# Patient Record
Sex: Male | Born: 1945 | Race: White | Hispanic: No | Smoking: Former smoker
Health system: Southern US, Community
[De-identification: ages and names within clinical notes are randomized; demographics above are authoritative.]

## PROBLEM LIST (undated history)

## (undated) DIAGNOSIS — E785 Hyperlipidemia, unspecified: Secondary | ICD-10-CM

## (undated) DIAGNOSIS — I4891 Unspecified atrial fibrillation: Secondary | ICD-10-CM

## (undated) DIAGNOSIS — I5022 Chronic systolic (congestive) heart failure: Secondary | ICD-10-CM

## (undated) DIAGNOSIS — J449 Chronic obstructive pulmonary disease, unspecified: Secondary | ICD-10-CM

## (undated) DIAGNOSIS — I10 Essential (primary) hypertension: Secondary | ICD-10-CM

## (undated) DIAGNOSIS — E119 Type 2 diabetes mellitus without complications: Secondary | ICD-10-CM

## (undated) HISTORY — PX: SKIN CANCER EXCISION: SHX779

## (undated) HISTORY — PX: TONSILLECTOMY: SUR1361

## (undated) HISTORY — PX: NOSE SURGERY: SHX723

---

## 2008-12-18 ENCOUNTER — Inpatient Hospital Stay: Payer: Self-pay | Admitting: Specialist

## 2014-04-05 ENCOUNTER — Emergency Department: Payer: Self-pay

## 2014-11-07 ENCOUNTER — Observation Stay: Payer: Self-pay | Admitting: Internal Medicine

## 2015-01-06 NOTE — Consult Note (Signed)
PATIENT NAME:  Victor [name], Victor [name] MR#:  [id number] DATE OF BIRTH:  1946-01-11  DATE OF CONSULTATION:  11/07/2014  REFERRING PHYSICIAN:  Enid Baas, MD CONSULTING PHYSICIAN:  Dwayne D. Callwood, MD  PRIMARY CARE PHYSICIAN:  [facility] [place]   INDICATION: Palpitations and chest pain, what appears to be atrial fibrillation.    HISTORY OF PRESENT ILLNESS: The patient is a 69 year old white male with past history of hypertension, diabetes, hyperlipidemia, congestive heart failure came in with palpitations recurrent and vague chest tightness.  We found that his heart rate was up to 140.  Denies any previous diagnosis of atrial fibrillation or arrhythmia. He has had skipping beats in the past. The patient had a cardiac catheterization a few years ago for heart failure and appeared to not have any significant coronary artery disease at the time.  He was treated with Lasix which he uses p.r.n.  Denies any significant angina. No blackout spells or syncope.  He is being admitted for evaluation of atrial fibrillation.   PAST MEDICAL HISTORY: Hypertension, hyperlipidemia, congestive heart failure, diabetes,   PAST SURGICAL HISTORY: Nose surgery.   ALLERGIES: None.   MEDICATIONS: Allopurinol 3 tablets daily, Norvasc 10 mg a day,  12% topical cream to affected area twice a day, aspirin 81 mg a day, bupropion 150 mg every 12 hours, Coreg 25 twice a day, diclofenac 75 mg twice a day, glipizide 10 mg 2 tablets twice a day, potassium chloride 20 mEq a day, simvastatin 20 a bedtime.   SOCIAL HISTORY: Lives at home by himself; his wife in [place]. No alcohol abuse.   FAMILY HISTORY: Cardiac issues, kidney disease, congestive heart failure.   REVIEW OF SYSTEMS:  No blackout spells or syncope. No nausea, vomiting. Denies fever, chills, sweats. Denies weight loss, weight gain. No hemoptysis or hematemesis. No bright red blood per rectum. He has had palpitations, tachycardia, and irregular heartbeat.   PHYSICAL  EXAMINATION:  VITAL SIGNS: Blood pressure 130/90, pulse 90, respiratory rate 16, afebrile.  HEENT: Normocephalic, atraumatic. Pupils equal and reactive to light.  NECK: Supple. No significant JVD, bruits or adenopathy.  LUNGS: Clear to auscultation and percussion. No significant wheezing, rhonchi, or rale. HEART:  At this point is regular. Systolic ejection murmur left sternal border. Positive S4. No significant S3.  ABDOMEN: Benign.  EXTREMITIES: Within normal limits.  NEUROLOGIC: Intact.  SKIN: Normal.   LABORATORY DATA: Initial EKG: Rapid atrial fibrillation, rate of 130, nonspecific findings. Sodium of 140, potassium 3.0, chloride 103, bicarbonate of 25, BUN of 22, creatinine 1.2, glucose of 228. LFTs negative, CK 57.  MB 1.1. Troponin 0.02. INR 0.9. White count 54, hemoglobin 12, hematocrit 36, platelet count 173,000.  BNP was elevated. TSH 2.37. Chest x-ray showed cardiomegaly. No significant heart failure.   ASSESSMENT:  1.  New-onset atrial fibrillation. 2.  Diabetes. 3.  Hypertension. 4.  Hypokalemia. 5.  History of congestive heart failure. 6.  Mild obesity. 7.  Gout.    PLAN:  1.  Agree with admit. Treat atrial fibrillation and rate control. Consider rhythm control as well as anticoagulation based on his significant risk for cerebrovascular accident by CHADS2 score so low long-term anticoagulation should be helpful.  2.  Would probably recommend evaluation for coronary artery disease at some point possibly a functional study, but that can be done as an outpatient.  3.  Diabetes, continue current therapy with medication for strict control including weight loss.  4.  Hypertension continue Coreg therapy as necessary.  5.  Continue  gout therapy with allopurinol.  6.  Recommend physical therapy for activity.  7.  Consider a sleep study for possible obstructive sleep apnea.  8.  Hyperlipidemia, continue simvastatin. Lipid and liver studies should be followed by his primary  physician.  9.  Continue low dose aspirin for now at 81 mg, including a long-term anticoagulation.  10. Hypertension,  with Norvasc as well as Coreg.   Have the patient follow up with cardiology either here or [facility].  Would recommend a conservative cardiology input except for surveillance for possible coronary artery disease.      ____________________________ Bobbie Stackwayne D. Juliann Paresallwood, MD ddc:DT D: 11/08/2014 05:59:00 ET T: 11/08/2014 11:17:59 ET JOB#: [id number]  cc: Dwayne D. Juliann Paresallwood, MD, <Dictator> Alwyn PeaWAYNE D CALLWOOD MD ELECTRONICALLY SIGNED 11/25/2014 17:41

## 2015-01-06 NOTE — H&P (Signed)
PATIENT NAME:  Victor [name], Victor [name] MR#:  [id number] DATE OF BIRTH:  1945/11/19  DATE OF ADMISSION:  11/07/2014  ADMITTING PHYSICIAN: Enid Baas, MD   PRIMARY CARE PHYSICIAN: [facility]  CHIEF COMPLAINT: Palpitations.   HISTORY OF PRESENT ILLNESS: Mr. Coryell is a 69 year old Caucasian male with past medical history significant for hypertension, non-insulin-dependent diabetes mellitus, hyperlipidemia, possible congestive heart failure, who presents to the hospital secondary to worsening palpitations since last night. The patient is noted to be in atrial fibrillation with heart rate ranging from 100 to 140. The patient denies any previous diagnosis of atrial fibrillation or any other arrhythmias. He said he had trouble with skipping beats about 6 years ago, at which time, at the [place], he had a cardiac catheterization and it seemed, according to his report, everything was normal at the time. He was treated for possible congestive heart failure with Lasix and does not know his ejection fraction, but does use Lasix p.r.n. at home. Denies any chest pain, nausea, vomiting, fevers, dyspnea at this time. He is being admitted under observation for his atrial fibrillation.   PAST MEDICAL HISTORY:  1. Hypertension.  2. Hyperlipidemia.  3. Congestive heart failure.   PAST SURGICAL HISTORY: Nose cartilage surgery.   ALLERGIES TO MEDICATIONS: NO KNOWN DRUG ALLERGIES.   CURRENT HOME MEDICATIONS:  1. Allopurinol 100 mg 2 tablets p.o. daily.  2. Norvasc 10 mg p.o. daily.  3. Ammonium lactate 12% topical cream to affected area twice a day.  4. Aspirin 81 mg p.o. daily.  5. Bupropion 150 mg p.o. 12 hours.  6. Coreg 25 mg p.o. b.i.d.  7. Diclofenac sodium 75 mg p.o. twice a day.  8. Glipizide 10 mg 2 tablets p.o. b.i.d.  9. Potassium chloride 20 mEq p.o. daily on days with Lasix.  10. Simvastatin 20 mg p.o. daily at bedtime.   SOCIAL HISTORY: Lives at home by himself. Does not use any assisting devices for  ambulation. Wife has cortico-basilar deterioration dementia and is a resident of [facility] now. The patient denies any smoking or alcohol use.   FAMILY HISTORY: Mom passed away at the age of 12. She did have a cardiac issues and kidney issues. Dad died in his 37s and had congestive heart failure.   REVIEW OF SYSTEMS:  CONSTITUTIONAL: No fatigue, fever, or weakness.  EYES: No blurred vision, double vision, inflammation or glaucoma. Uses glasses.  ENT: No tinnitus, ear pain, hearing loss, epistaxis or discharge.  RESPIRATORY: No cough, wheezing, hemoptysis, or chronic obstructive pulmonary disease.  CARDIOVASCULAR: No chest pain, orthopnea, edema. Positive for arrhythmia and palpitations. No syncope.  GASTROINTESTINAL: No nausea, vomiting, diarrhea, abdominal pain, hematemesis, or melena.  GENITOURINARY: No dysuria, hematuria, renal calculus, frequency, or incontinence.  ENDOCRINE: No polyuria, nocturia, thyroid problems, heat or cold intolerance.  HEMATOLOGY: No anemia, easy bruising or bleeding.  SKIN: No acne, rash or lesions.  MUSCULOSKELETAL: No neck  or back pain, arthritis or gout.  NEUROLOGIC: No numbness, weakness, CVA, transient ischemic attack, or seizures.  PSYCHOLOGICAL: No anxiety or insomnia. Positive for depression.   PHYSICAL EXAMINATION:  VITAL SIGNS: Temperature 97.1 degrees Fahrenheit, pulse 89, respirations 18, blood pressure 124/91, pulse oximetry 95% on room air.  GENERAL: Well-built, well-nourished male, lying in bed, not in any acute distress.  HEENT: Normocephalic, atraumatic. Pupils equal, round, reacting to light. Anicteric sclerae. Extraocular movements intact.  OROPHARYNX: Clear without erythema, mass, or exudates.  NECK: Supple. No thyromegaly, JVD or carotid bruits. No lymphadenopathy.  LUNGS: Moving air  bilaterally. No wheeze or crackles. No use of accessory muscles for breathing.  CARDIOVASCULAR: S1, S2, irregular rhythm and normal rate. No murmurs,  rubs, or gallops.  ABDOMEN: Soft, nontender, nondistended. No hepatosplenomegaly. Normal bowel sounds.  EXTREMITIES: No pedal edema. No clubbing or cyanosis, 2+ dorsalis pedis pulses palpable bilaterally.  SKIN: No acne, rash or lesions.  LYMPHATICS: No cervical or inguinal lymphadenopathy.  NEUROLOGIC: Cranial nerves intact. No focal motor or sensory deficits. PSYCHOLOGIC: The patient is awake, alert, oriented x3.   LABORATORY DATA: Sodium 140, potassium 3.0, chloride 103, bicarbonate 25, BUN 22, creatinine 1.21, glucose 228, and calcium of 8.4.   ALT 23, AST 27, alkaline phosphatase 132, total bilirubin 0.4 and albumin of 3.0.   CK is 57, CK-MB is 1.1 and a troponin 0.03. INR 0.9.   WBC 5.4, hemoglobin 12.3, hematocrit 36.5, platelet count 173. TSH within normal limits at 2.37. BNP is slightly elevated at 519,000 and serum magnesium is 2.4. Chest x-ray showing mild cardiomegaly without congestive heart failure and EKG showing atrial fibrillation with rapid ventricular response. Heart rate of 112.   ASSESSMENT AND PLAN: This is a 69 year old male with history of hypertension, diabetes, hyperlipidemia, congestive heart failure, admitted for new onset atrial fibrillation.   1. New onset atrial fibrillation. Intravenous Cardizem pushes p.r.n., started on p.o. metoprolol. No need for any drip now. CHA2DS2-VAS score is about 4. We will start anticoagulation with Eliquis. Cardiology has been consulted. Echocardiogram has been ordered. Replace potassium.  2. Hypertension. Need to verify home medications: Will restart on his home medications now.  3. Diabetes mellitus on glipizide and sliding scale insulin.  4. Hypokalemia- being replaced.   5. Congestive heart failure, unknown ejection fraction. Followup echocardiogram. His last cardiac catheterization is normal from 2010. Chest x-ray showing no congestion. Well compensated, so hold off Lasix at this time.  6. Deep vein thrombosis prophylaxis.    CODE STATUS: Full code.   TIME SPENT ON ADMISSION: 50 minutes.   ____________________________ Enid Baasadhika Caedan Sumler, MD rk:ap D: 11/07/2014 16:28:20 ET T: 11/07/2014 17:08:00 ET JOB#: [id number]  cc: Enid Baasadhika Eldwin Volkov, MD, <Dictator> Enid BaasADHIKA Dinari Stgermaine MD ELECTRONICALLY SIGNED 11/22/2014 18:05

## 2015-01-06 NOTE — Discharge Summary (Signed)
PATIENT NAME:  Victor [name], [name] MR#:  [id number] DATE OF BIRTH:  06-04-1946  DATE OF ADMISSION:  11/07/2014 DATE OF DISCHARGE:  11/08/2014  ADMITTING PHYSICIAN: Enid Baasadhika Hatley Henegar, MD  DISCHARGING PHYSICIAN: Enid Baasadhika Susannah Carbin, MD   [facility]  CONSULTATIONS IN THE HOSPITAL: Cardiology consultation with Dr. Juliann Paresallwood.   DISCHARGE DIAGNOSES:  1.  New onset atrial fibrillation, converted to normal sinus rhythm.  2.  Systolic congestive heart failure, well compensated, ejection fraction of 45%-50%.  3.  Hyperkalemia.  4.  Hypertension.  5.  Diabetes mellitus.  6.  Depression.  7.  Gout.   DISCHARGE HOME MEDICATIONS:  1.  Allopurinol 200 mg p.o. daily.  2.  Ammonia lactate 12% topical twice a day.  3.  Potassium chloride 20 mEq daily.  4.  Diclofenac 75 mg p.o. b.i.d.  5.  Coreg 25 mg p.o. b.i.d.  6.  Simvastatin 20 mg p.o. daily.  7.  Aspirin 81 mg p.o. daily.  8.  Glipizide 10 mg tablet 2 tablets twice a day.  9.  Norvasc 10 mg p.o. daily.  10.  Bupropion 150 mg 1 tablet p.o. b.i.d.  11.  Eliquis 5 mg p.o. b.i.d.   DISCHARGE HOME OXYGEN: None.   DISCHARGE DIET: Low sodium, ADA 1800 calorie diet.   DISCHARGE ACTIVITY: As tolerated.    FOLLOWUP INSTRUCTIONS:  1.  PCP followup in 1-2 weeks.  2.  Cardiology followup in 2-4 weeks.   LABORATORY AND IMAGING STUDIES PRIOR TO DISCHARGE:  1.  WBC 4.9, hemoglobin 12.0, hematocrit 35.9, platelet count is 156,000.  2.  Sodium 142, potassium 3.1, chloride 107, bicarbonate 26, BUN 21, creatinine 1.37, glucose 191, and calcium of 8.1. Magnesium is 2.2.  3.  Echo Doppler showing LV ejection fraction is 45%-50%, mildly increased left ventricular septal thickness.   4.  Chest x-ray showing mild cardiomegaly without any overt failure.  5.  Troponins remain negative in the hospital course. 6.  BNP slightly elevated at 519.  7.  TSH is within normal limits at 2.37.   BRIEF HOSPITAL COURSE: Victor [name] is a 69 year old  Caucasian male with past medical history significant for systolic CHF, hypertension, diabetes, hyperlipidemia, who presents to the hospital secondary to palpitations and noted to have atrial fibrillation with RVR.  1.  New onset atrial fibrillation with rapid ventricular response. CHADS-VASc score is counted to be 4 because of his other medical problems. He started on Eliquis in the hospital. He required a couple of Cardizem pushes and started on p.o. metoprolol; however, at discharge he is being changed over to his Coreg. He converted back to normal sinus rhythm has not been symptomatic. He was advised to follow with PCP as an outpatient. Seen by cardiology in the hospital who recommend to continue current management. The patient had a cardiac catheterization in the past and, according to him, he did not have any coronary artery disease.  2.  Systolic CHF, well compensated. Continue all his cardiac medications.  3.  Hypertension. Continue home medications.  4.  Depression on Wellbutrin.   His course has been otherwise uneventful in the hospital.   DISCHARGE CONDITION: Stable.   DISCHARGE DISPOSITION: Home.   TIME SPENT ON DISCHARGE: 40 minutes.    ____________________________ Enid Baasadhika Roger Fasnacht, MD rk:bm D: 11/08/2014 15:14:01 ET T: 11/08/2014 23:14:32 ET JOB#: [id number]  cc: Enid Baasadhika Britanni Yarde, MD, <Dictator> Enid BaasADHIKA Sabino Denning MD ELECTRONICALLY SIGNED 11/22/2014 18:06

## 2015-04-05 ENCOUNTER — Emergency Department: Payer: Medicare Other

## 2015-04-05 ENCOUNTER — Inpatient Hospital Stay
Admission: EM | Admit: 2015-04-05 | Discharge: 2015-04-07 | DRG: 309 | Disposition: A | Discharge status: Home / Self Care | Payer: Medicare Other | Attending: Specialist | Admitting: Specialist

## 2015-04-05 DIAGNOSIS — Z9889 Other specified postprocedural states: Secondary | ICD-10-CM | POA: Diagnosis not present

## 2015-04-05 DIAGNOSIS — N179 Acute kidney failure, unspecified: Secondary | ICD-10-CM | POA: Diagnosis present

## 2015-04-05 DIAGNOSIS — I10 Essential (primary) hypertension: Secondary | ICD-10-CM | POA: Diagnosis present

## 2015-04-05 DIAGNOSIS — E119 Type 2 diabetes mellitus without complications: Secondary | ICD-10-CM | POA: Diagnosis present

## 2015-04-05 DIAGNOSIS — I5022 Chronic systolic (congestive) heart failure: Secondary | ICD-10-CM | POA: Diagnosis present

## 2015-04-05 DIAGNOSIS — E860 Dehydration: Secondary | ICD-10-CM | POA: Diagnosis present

## 2015-04-05 DIAGNOSIS — R55 Syncope and collapse: Secondary | ICD-10-CM

## 2015-04-05 DIAGNOSIS — I482 Chronic atrial fibrillation: Secondary | ICD-10-CM | POA: Diagnosis present

## 2015-04-05 DIAGNOSIS — R001 Bradycardia, unspecified: Secondary | ICD-10-CM | POA: Diagnosis present

## 2015-04-05 DIAGNOSIS — I4891 Unspecified atrial fibrillation: Secondary | ICD-10-CM | POA: Diagnosis present

## 2015-04-05 DIAGNOSIS — E876 Hypokalemia: Secondary | ICD-10-CM | POA: Diagnosis present

## 2015-04-05 DIAGNOSIS — E785 Hyperlipidemia, unspecified: Secondary | ICD-10-CM | POA: Diagnosis present

## 2015-04-05 DIAGNOSIS — R002 Palpitations: Secondary | ICD-10-CM

## 2015-04-05 DIAGNOSIS — Z87891 Personal history of nicotine dependence: Secondary | ICD-10-CM | POA: Diagnosis not present

## 2015-04-05 DIAGNOSIS — Z794 Long term (current) use of insulin: Secondary | ICD-10-CM | POA: Diagnosis not present

## 2015-04-05 DIAGNOSIS — Z79899 Other long term (current) drug therapy: Secondary | ICD-10-CM | POA: Diagnosis not present

## 2015-04-05 HISTORY — DX: Chronic systolic (congestive) heart failure: I50.22

## 2015-04-05 HISTORY — DX: Type 2 diabetes mellitus without complications: E11.9

## 2015-04-05 HISTORY — DX: Hyperlipidemia, unspecified: E78.5

## 2015-04-05 HISTORY — DX: Essential (primary) hypertension: I10

## 2015-04-05 HISTORY — DX: Unspecified atrial fibrillation: I48.91

## 2015-04-05 LAB — CBC
HCT: 41.1 % (ref 40.0–52.0)
Hemoglobin: 13.8 g/dL (ref 13.0–18.0)
MCH: 33.2 pg (ref 26.0–34.0)
MCHC: 33.6 g/dL (ref 32.0–36.0)
MCV: 98.7 fL (ref 80.0–100.0)
Platelets: 185 10*3/uL (ref 150–440)
RBC: 4.17 MIL/uL — ABNORMAL LOW (ref 4.40–5.90)
RDW: 13.8 % (ref 11.5–14.5)
WBC: 6.5 10*3/uL (ref 3.8–10.6)

## 2015-04-05 LAB — URINALYSIS COMPLETE WITH MICROSCOPIC (ARMC ONLY)
BILIRUBIN URINE: NEGATIVE
Bacteria, UA: NONE SEEN
Glucose, UA: 50 mg/dL — AB
HGB URINE DIPSTICK: NEGATIVE
KETONES UR: NEGATIVE mg/dL
Leukocytes, UA: NEGATIVE
Nitrite: NEGATIVE
Protein, ur: 30 mg/dL — AB
Specific Gravity, Urine: 1.015 (ref 1.005–1.030)
Squamous Epithelial / LPF: NONE SEEN
pH: 6 (ref 5.0–8.0)

## 2015-04-05 LAB — APTT: APTT: 72 s — AB (ref 24–36)

## 2015-04-05 LAB — BASIC METABOLIC PANEL
Anion gap: 13 (ref 5–15)
BUN: 44 mg/dL — ABNORMAL HIGH (ref 6–20)
CALCIUM: 8.3 mg/dL — AB (ref 8.9–10.3)
CHLORIDE: 97 mmol/L — AB (ref 101–111)
CO2: 24 mmol/L (ref 22–32)
Creatinine, Ser: 2.11 mg/dL — ABNORMAL HIGH (ref 0.61–1.24)
GFR calc Af Amer: 35 mL/min — ABNORMAL LOW (ref >60)
GFR calc non Af Amer: 31 mL/min — ABNORMAL LOW (ref >60)
GLUCOSE: 290 mg/dL — AB (ref 65–99)
Potassium: 4 mmol/L (ref 3.5–5.1)
Sodium: 134 mmol/L — ABNORMAL LOW (ref 135–145)

## 2015-04-05 LAB — PROTIME-INR
INR: 2.34
Prothrombin Time: 25.8 seconds — ABNORMAL HIGH (ref 11.4–15.0)

## 2015-04-05 LAB — GLUCOSE, CAPILLARY: GLUCOSE-CAPILLARY: 207 mg/dL — AB (ref 65–99)

## 2015-04-05 LAB — TROPONIN I: Troponin I: <0.03 ng/mL (ref <0.031)

## 2015-04-05 MED ORDER — POTASSIUM CHLORIDE CRYS ER 20 MEQ PO TBCR
40.0000 meq | EXTENDED_RELEASE_TABLET | Freq: Every day | ORAL | Status: DC
  Administered 2015-04-06 – 2015-04-07 (×2): 40 meq via ORAL
  Filled 2015-04-05 (×2): qty 2

## 2015-04-05 MED ORDER — BUPROPION HCL ER (SR) 150 MG PO TB12
150.0000 mg | ORAL_TABLET | Freq: Two times a day (BID) | ORAL | Status: DC
  Administered 2015-04-05 – 2015-04-07 (×4): 150 mg via ORAL
  Filled 2015-04-05 (×6): qty 1

## 2015-04-05 MED ORDER — INSULIN ASPART 100 UNIT/ML ~~LOC~~ SOLN
0.0000 [IU] | Freq: Three times a day (TID) | SUBCUTANEOUS | Status: DC
  Filled 2015-04-05: qty 5

## 2015-04-05 MED ORDER — ACETAMINOPHEN 650 MG RE SUPP: 650.0000 mg | Freq: Four times a day (QID) | RECTAL | Status: DC | PRN

## 2015-04-05 MED ORDER — ACETAMINOPHEN 325 MG PO TABS: 650.0000 mg | ORAL_TABLET | Freq: Four times a day (QID) | ORAL | Status: DC | PRN

## 2015-04-05 MED ORDER — AMMONIUM LACTATE 12 % EX CREA: 1.0000 g | TOPICAL_CREAM | CUTANEOUS | Status: DC | PRN

## 2015-04-05 MED ORDER — DABIGATRAN ETEXILATE MESYLATE 75 MG PO CAPS
150.0000 mg | ORAL_CAPSULE | Freq: Two times a day (BID) | ORAL | Status: DC
  Administered 2015-04-05 – 2015-04-07 (×4): 150 mg via ORAL
  Filled 2015-04-05 (×4): qty 2

## 2015-04-05 MED ORDER — ALLOPURINOL 100 MG PO TABS
200.0000 mg | ORAL_TABLET | Freq: Every day | ORAL | Status: DC
  Administered 2015-04-06 – 2015-04-07 (×2): 200 mg via ORAL
  Filled 2015-04-05 (×2): qty 2

## 2015-04-05 MED ORDER — INSULIN GLARGINE 100 UNIT/ML ~~LOC~~ SOLN
14.0000 [IU] | Freq: Every evening | SUBCUTANEOUS | Status: DC
  Administered 2015-04-05 – 2015-04-06 (×2): 14 [IU] via SUBCUTANEOUS
  Filled 2015-04-05 (×5): qty 0.14

## 2015-04-05 MED ORDER — SODIUM CHLORIDE 0.9 % IV SOLN
INTRAVENOUS | Status: DC
  Administered 2015-04-05 – 2015-04-07 (×4): via INTRAVENOUS

## 2015-04-05 MED ORDER — DABIGATRAN ETEXILATE MESYLATE 75 MG PO CAPS: 75.0000 mg | ORAL_CAPSULE | Freq: Two times a day (BID) | ORAL | Status: DC

## 2015-04-05 MED ORDER — GLIPIZIDE 5 MG PO TABS
20.0000 mg | ORAL_TABLET | Freq: Two times a day (BID) | ORAL | Status: DC
  Administered 2015-04-06 – 2015-04-07 (×3): 20 mg via ORAL
  Filled 2015-04-05 (×3): qty 4

## 2015-04-05 MED ORDER — SIMVASTATIN 10 MG PO TABS
10.0000 mg | ORAL_TABLET | Freq: Every day | ORAL | Status: DC
  Administered 2015-04-05 – 2015-04-06 (×2): 10 mg via ORAL
  Filled 2015-04-05 (×2): qty 1

## 2015-04-05 MED ORDER — ONDANSETRON HCL 4 MG PO TABS: 4.0000 mg | ORAL_TABLET | Freq: Four times a day (QID) | ORAL | Status: DC | PRN

## 2015-04-05 MED ORDER — DILTIAZEM HCL 25 MG/5ML IV SOLN
15.0000 mg | INTRAVENOUS | Status: AC
  Administered 2015-04-05: 15 mg via INTRAVENOUS
  Filled 2015-04-05: qty 5

## 2015-04-05 MED ORDER — DOCUSATE SODIUM 100 MG PO CAPS
100.0000 mg | ORAL_CAPSULE | Freq: Two times a day (BID) | ORAL | Status: DC
  Administered 2015-04-05 – 2015-04-07 (×4): 100 mg via ORAL
  Filled 2015-04-05 (×4): qty 1

## 2015-04-05 MED ORDER — CARVEDILOL 25 MG PO TABS
25.0000 mg | ORAL_TABLET | Freq: Two times a day (BID) | ORAL | Status: DC
  Administered 2015-04-05 – 2015-04-07 (×4): 25 mg via ORAL
  Filled 2015-04-05 (×4): qty 1

## 2015-04-05 MED ORDER — INSULIN ASPART 100 UNIT/ML ~~LOC~~ SOLN
0.0000 [IU] | Freq: Every day | SUBCUTANEOUS | Status: DC
  Administered 2015-04-05: 2 [IU] via SUBCUTANEOUS
  Administered 2015-04-06: 3 [IU] via SUBCUTANEOUS
  Filled 2015-04-05: qty 3
  Filled 2015-04-05: qty 2

## 2015-04-05 MED ORDER — DILTIAZEM HCL 60 MG PO TABS
30.0000 mg | ORAL_TABLET | Freq: Three times a day (TID) | ORAL | Status: DC
  Administered 2015-04-05 – 2015-04-06 (×2): 30 mg via ORAL
  Filled 2015-04-05 (×2): qty 1

## 2015-04-05 MED ORDER — ONDANSETRON HCL 4 MG/2ML IJ SOLN: 4.0000 mg | Freq: Four times a day (QID) | INTRAMUSCULAR | Status: DC | PRN

## 2015-04-05 MED ORDER — SODIUM CHLORIDE 0.9 % IJ SOLN
3.0000 mL | Freq: Two times a day (BID) | INTRAMUSCULAR | Status: DC
  Administered 2015-04-05: 3 mL via INTRAVENOUS

## 2015-04-05 MED ORDER — SODIUM CHLORIDE 0.9 % IV BOLUS (SEPSIS)
500.0000 mL | Freq: Once | INTRAVENOUS | Status: AC
  Administered 2015-04-05: 500 mL via INTRAVENOUS

## 2015-04-05 NOTE — ED Notes (Signed)
Pt sitting up in bed, denies pain, axox4, report given to floor to room 241, pt transported to room via w/c with ed tech mike

## 2015-04-05 NOTE — H&P (Signed)
[facility] [place] at [facility]   PATIENT NAME: Victor [name]    MR#:  [id number]  DATE OF BIRTH:  [birth year]  DATE OF ADMISSION:  04/05/2015  PRIMARY CARE PHYSICIAN: Konrad Felix, NP   REQUESTING/REFERRING PHYSICIAN: Dr.Gayle  CHIEF COMPLAINT:   Chief Complaint  Patient presents with  . Weakness    HISTORY OF PRESENT ILLNESS:  Victor [name]  is a 69 y.o. male with a known history of chronic systolic CHF, hypertension, diabetes, atrial fibrillation. Axilla presents to the hospital with an episode of syncope that he had yesterday evening. Patient got out of his recliner and walk to the kitchen added a few ingredients to the day she was cooking. After this he turned around and was going back to his recliner when he passed out. He feels he could have passed out only for a few seconds. Did not have any incontinence to urine or bowel. He felt extremely weak had to drag himself to the recliner. Has had a few episodes of lightheadedness and dizziness since. He has had 3 episodes of syncope in the last 2 weeks. Palpitations on and off. No chest pain or shortness of breath. No orthopnea or lower extremity edema. No recent change in medications. He follows at the [place]. Patient has a log of his blood pressure and heart rate with him and his heart rate seems to have consistently been in the 70s to 80s but is in 110s to 130s in the last 3 days. In the emergency room his creatinine has been found to have increased to 2.17 from 1 in the past. Also his heart rate has been fluctuating between 110-160 in atrial fibrillation.  PAST MEDICAL HISTORY:   Past Medical History  Diagnosis Date  . Atrial fibrillation   . Diabetes mellitus without complication   . Hypertension   . Hyperlipemia   . Chronic systolic CHF (congestive heart failure)     PAST SURGICAL HISTORY:   Past Surgical History  Procedure Laterality Date  . Tonsillectomy      SOCIAL HISTORY:   History   Substance Use Topics  . Smoking status: Former Games developer  . Smokeless tobacco: Not on file  . Alcohol Use: No    FAMILY HISTORY:   Family History  Problem Relation Age of Onset  . Other Other     DRUG ALLERGIES:  No Known Allergies  REVIEW OF SYSTEMS:   Review of Systems  Constitutional: Positive for malaise/fatigue. Negative for fever, chills and weight loss.  HENT: Negative for hearing loss and nosebleeds.   Eyes: Negative for blurred vision, double vision and pain.  Respiratory: Negative for cough, hemoptysis, sputum production, shortness of breath and wheezing.   Cardiovascular: Positive for palpitations. Negative for chest pain, orthopnea and leg swelling.  Gastrointestinal: Negative for nausea, vomiting, abdominal pain, diarrhea and constipation.  Genitourinary: Negative for dysuria and hematuria.  Musculoskeletal: Negative for myalgias, back pain and falls.  Skin: Negative for rash.  Neurological: Positive for loss of consciousness, weakness and headaches. Negative for dizziness, tremors, sensory change, speech change, focal weakness and seizures.  Endo/Heme/Allergies: Does not bruise/bleed easily.  Psychiatric/Behavioral: Negative for depression and memory loss. The patient is not nervous/anxious.     MEDICATIONS AT HOME:   Prior to Admission medications   Medication Sig Start Date End Date Taking? Authorizing Provider  allopurinol (ZYLOPRIM) 100 MG tablet Take 200 mg by mouth daily.   Yes Historical Provider, MD  ammonium lactate (AMLACTIN) 12 % cream  Apply 1 g topically as needed for dry skin.   Yes Historical Provider, MD  buPROPion (WELLBUTRIN SR) 150 MG 12 hr tablet Take 150 mg by mouth 2 (two) times daily.   Yes Historical Provider, MD  carvedilol (COREG) 25 MG tablet Take 25 mg by mouth every 12 (twelve) hours.   Yes Historical Provider, MD  chlorthalidone (HYGROTON) 50 MG tablet Take 50 mg by mouth daily.   Yes Historical Provider, MD  dabigatran (PRADAXA) 150  MG CAPS capsule Take 150 mg by mouth every 12 (twelve) hours.   Yes Historical Provider, MD  glipiZIDE (GLUCOTROL) 10 MG tablet Take 20 mg by mouth 2 (two) times daily.   Yes Historical Provider, MD  insulin glargine (LANTUS) 100 UNIT/ML injection Inject 14 Units into the skin every evening.   Yes Historical Provider, MD  lisinopril (PRINIVIL,ZESTRIL) 20 MG tablet Take 10 mg by mouth daily.   Yes Historical Provider, MD  potassium chloride SA (K-DUR,KLOR-CON) 20 MEQ tablet Take 40 mEq by mouth daily.   Yes Historical Provider, MD  simvastatin (ZOCOR) 20 MG tablet Take 10 mg by mouth at bedtime.   Yes Historical Provider, MD      VITAL SIGNS:  Blood pressure 152/97, pulse 109, temperature 97.7 F (36.5 C), temperature source Oral, resp. rate 18, height 6' (1.829 m), weight 79.833 kg (176 lb), SpO2 100 %.  PHYSICAL EXAMINATION:  Physical Exam  GENERAL:  69 y.o.-year-old patient lying in the bed with no acute distress.  EYES: Pupils equal, round, reactive to light and accommodation. No scleral icterus. Extraocular muscles intact.  HEENT: Head atraumatic, normocephalic. Oropharynx and nasopharynx clear. No oropharyngeal erythema, moist oral mucosa  NECK:  Supple, no jugular venous distention. No thyroid enlargement, no tenderness.  LUNGS: Normal breath sounds bilaterally, no wheezing, rales, rhonchi. No use of accessory muscles of respiration.  CARDIOVASCULAR: S1, S2 tachycardic, irregular. No murmurs, rubs, or gallops.  ABDOMEN: Soft, nontender, nondistended. Bowel sounds present. No organomegaly or mass.  EXTREMITIES: No pedal edema, cyanosis, or clubbing. + 2 pedal & radial pulses b/l.   NEUROLOGIC: Cranial nerves II through XII are intact. No focal Motor or sensory deficits appreciated b/l PSYCHIATRIC: The patient is alert and oriented x 3. Good affect.  SKIN: No obvious rash, lesion, or ulcer.   LABORATORY PANEL:   CBC  Recent Labs Lab 04/05/15 1431  WBC 6.5  HGB 13.8  HCT 41.1   PLT 185   ------------------------------------------------------------------------------------------------------------------  Chemistries   Recent Labs Lab 04/05/15 1431  NA 134*  K 4.0  CL 97*  CO2 24  GLUCOSE 290*  BUN 44*  CREATININE 2.11*  CALCIUM 8.3*   ------------------------------------------------------------------------------------------------------------------  Cardiac Enzymes  Recent Labs Lab 04/05/15 1431  TROPONINI <0.03   ------------------------------------------------------------------------------------------------------------------  RADIOLOGY:  Dg Chest 2 View  04/05/2015   CLINICAL DATA:  Syncope.  EXAM: CHEST  2 VIEW  COMPARISON:  11/07/2014  FINDINGS: Heart size and pulmonary vascularity are normal and the lungs are clear. No effusions. Are slight thoracolumbar scoliosis. Calcification in the aortic arch.  IMPRESSION: No acute abnormalities.  Aortic atherosclerosis.   Electronically Signed   By: Francene Boyers M.D.   On: 04/05/2015 16:23   Ct Head Wo Contrast  04/05/2015   CLINICAL DATA:  Syncope, does not feel good, passed out last night  EXAM: CT HEAD WITHOUT CONTRAST  TECHNIQUE: Contiguous axial images were obtained from the base of the skull through the vertex without intravenous contrast.  COMPARISON:  None  FINDINGS: There  is no evidence of mass effect, midline shift, or extra-axial fluid collections. There is no evidence of a space-occupying lesion or intracranial hemorrhage. There is no evidence of a cortical-based area of acute infarction. There is generalized cerebral atrophy. There is periventricular white matter low attenuation likely secondary to microangiopathy.  The ventricles and sulci are appropriate for the patient's age. The basal cisterns are patent.  Visualized portions of the orbits are unremarkable. The visualized portions of the paranasal sinuses and mastoid air cells are unremarkable. Cerebrovascular atherosclerotic calcifications are  noted.  The osseous structures are unremarkable.  IMPRESSION: 1. No acute intracranial pathology. 2. Chronic microvascular disease and cerebral atrophy.   Electronically Signed   By: Elige Ko   On: 04/05/2015 16:18     IMPRESSION AND PLAN:   * Afib with RVR Stat dose of Cardizem IV 10 mg. Patient's heart rate is going up to 160s. Continue his home dose of Coreg and add Cardizem 30 mg 3 times a day. Need to watch for bradycardia. We will continue Pradaxa renally dosed. Telemetry monitoring. May need to start Cardizem dose if heart rate doesn't improve.  * Acute renal failure likely from dehydration. Start IV fluids. Monitor input output and daily weight. Hold diuretics. Hold ACEi and chlorthalidone.  * Syncope likely due to A. fib with RVR. We will check troponins and echocardiogram. Dehydration could also be a contribution.  * Chronic systolic congestive heart failure with ejection fraction 35-40%. No signs of fluid overload.  * Diabetes mellitus type 2 Continue his home dose of long-acting insulin and glipizide. We will also start sliding scale insulin. Diabetic diet.   * DVT prophylaxis - on pradaxa.   All the records are reviewed and case discussed with ED provider. Management plans discussed with the patient, family and they are in agreement.  CODE STATUS: FULL CODE  TOTAL CRITICAL CARE TIME TAKING CARE OF THIS PATIENT: 40 minutes.    Milagros Loll R M.D on 04/05/2015 at 5:33 PM  Between 7am to 6pm - Pager - [phone]  After 6pm go to [email or web address] - password [email or web address] [facility]  [facility]  Office  [phone]  CC: Primary care physician; Konrad Felix, NP

## 2015-04-05 NOTE — ED Notes (Signed)
Pt reports that he "just doesn't feel good." States that he passed out last night, which has happened 3 times in 2 months. States that he knew it was coming but that he didn't have time to sit or lie down. Pt has blood ressure and HR log, which shows this morning his bp was 122/64 and HR was 66. Later, HR was over 120 and bp 146/??. Pt concerned that the HR is too low. He is on pradaxa for afib and also takes carvedilol. Pt alert & oriented with warm, dry skin.

## 2015-04-05 NOTE — ED Provider Notes (Signed)
[facility] Emergency Department Provider Note  ____________________________________________  Time seen: Approximately 3:11 PM  I have reviewed the triage vital signs and the nursing notes.   HISTORY  Chief Complaint Weakness    HPI Victor [name] is a 69 y.o. male with atrial fibrillation on pradaxa, hypertension and hyperlipidemia presents for evaluation of syncope, palpitations, mild intermittent shortness of breath, gradual onset last night, constant, current severity moderate. Patient reports he was walking from the kitchen to the living room last night when he suddenly fainted without prodrome. He does not think he sustained any injuries other than abrasion to left knee. He is not sure whether not he hit his head. Today he has had palpitations with heart rate ranging from 130s to 90s. He reports "my breathing feels different". No chest pain, no recent illness include no vomiting, diarrhea, fevers or chills. No modifying factors.   Past Medical History  Diagnosis Date  . Atrial fibrillation   . Diabetes mellitus without complication   . Hypertension   . Hyperlipemia     There are no active problems to display for this patient.   Past Surgical History  Procedure Laterality Date  . Tonsillectomy      Current Outpatient Rx  Name  Route  Sig  Dispense  Refill  . allopurinol (ZYLOPRIM) 100 MG tablet   Oral   Take 200 mg by mouth daily.         Marland Kitchen ammonium lactate (AMLACTIN) 12 % cream   Topical   Apply 1 g topically as needed for dry skin.         Marland Kitchen buPROPion (WELLBUTRIN SR) 150 MG 12 hr tablet   Oral   Take 150 mg by mouth 2 (two) times daily.         . carvedilol (COREG) 25 MG tablet   Oral   Take 25 mg by mouth every 12 (twelve) hours.         . chlorthalidone (HYGROTON) 50 MG tablet   Oral   Take 50 mg by mouth daily.         . dabigatran (PRADAXA) 150 MG CAPS capsule   Oral   Take 150 mg by mouth every 12 (twelve) hours.          Marland Kitchen glipiZIDE (GLUCOTROL) 10 MG tablet   Oral   Take 20 mg by mouth 2 (two) times daily.         . insulin glargine (LANTUS) 100 UNIT/ML injection   Subcutaneous   Inject 14 Units into the skin every evening.         Marland Kitchen lisinopril (PRINIVIL,ZESTRIL) 20 MG tablet   Oral   Take 10 mg by mouth daily.         . potassium chloride SA (K-DUR,KLOR-CON) 20 MEQ tablet   Oral   Take 40 mEq by mouth daily.         . simvastatin (ZOCOR) 20 MG tablet   Oral   Take 10 mg by mouth at bedtime.           Allergies Review of patient's allergies indicates no known allergies.  No family history on file.  Social History History  Substance Use Topics  . Smoking status: Former Games developer  . Smokeless tobacco: Not on file  . Alcohol Use: No    Review of Systems Constitutional: No fever/chills Eyes: No visual changes. ENT: No sore throat. Cardiovascular: Denies chest pain. Respiratory: + shortness of breath. Gastrointestinal: No abdominal pain.  No nausea, no vomiting.  No diarrhea.  No constipation. Genitourinary: Negative for dysuria. Musculoskeletal: Negative for back pain. Skin: Negative for rash. Neurological: Negative for headaches, focal weakness or numbness.  10-point ROS otherwise negative.  ____________________________________________   PHYSICAL EXAM:  VITAL SIGNS: ED Triage Vitals  Enc Vitals Group     BP 04/05/15 1417 152/97 mmHg     Pulse Rate 04/05/15 1417 109     Resp 04/05/15 1417 18     Temp 04/05/15 1417 97.7 F (36.5 C)     Temp Source 04/05/15 1417 Oral     SpO2 04/05/15 1417 100 %     Weight 04/05/15 1417 176 lb (79.833 kg)     Height 04/05/15 1417 6' (1.829 m)     Head Cir --      Peak Flow --      Pain Score 04/05/15 1419 0     Pain Loc --      Pain Edu? --      Excl. in GC? --     Constitutional: Alert and oriented. Well appearing and in no acute distress. Eyes: Conjunctivae are normal. PERRL. EOMI. Head: Atraumatic. Nose: No  congestion/rhinnorhea. Mouth/Throat: Mucous membranes are moist.  Oropharynx non-erythematous. Neck: No stridor.  No cervical spine tenderness to palpation. Cardiovascular: Normal rate, irregularly irregular rhythm. Grossly normal heart sounds.  Good peripheral circulation. Respiratory: Normal respiratory effort.  No retractions. Lungs CTAB. Gastrointestinal: Soft and nontender. No distention. No abdominal bruits. No CVA tenderness. Genitourinary: deferred Musculoskeletal: No lower extremity tenderness nor edema.  No joint effusions. Small abrasion just inferior to the left knee without any bony tenderness, full range of motion at the left knee. Neurologic:  Normal speech and language. No gross focal neurologic deficits are appreciated. No gait instability. Skin:  Skin is warm, dry and intact. No rash noted. Psychiatric: Mood and affect are normal. Speech and behavior are normal.  ____________________________________________   LABS (all labs ordered are listed, but only abnormal results are displayed)  Labs Reviewed  BASIC METABOLIC PANEL - Abnormal; Notable for the following:    Sodium 134 (*)    Chloride 97 (*)    Glucose, Bld 290 (*)    BUN 44 (*)    Creatinine, Ser 2.11 (*)    Calcium 8.3 (*)    GFR calc non Af Amer 31 (*)    GFR calc Af Amer 35 (*)    All other components within normal limits  CBC - Abnormal; Notable for the following:    RBC 4.17 (*)    All other components within normal limits  PROTIME-INR - Abnormal; Notable for the following:    Prothrombin Time 25.8 (*)    All other components within normal limits  APTT - Abnormal; Notable for the following:    aPTT 72 (*)    All other components within normal limits  TROPONIN I  URINALYSIS COMPLETEWITH MICROSCOPIC ([facility] ONLY)  CBG MONITORING, ED   ____________________________________________  EKG  ED ECG REPORT I, Gayla Doss, the attending physician, personally viewed and interpreted this ECG.   Date:  04/05/2015  EKG Time: 14:15  Rate: 109  Rhythm: atrial fibrillation, rate 109  Axis: normal  Intervals:no PR interval otherwise normal  ST&T Change: ST depression in V2, V3, V4  ____________________________________________  RADIOLOGY  CXR FINDINGS: Heart size and pulmonary vascularity are normal and the lungs are clear. No effusions. Are slight thoracolumbar scoliosis. Calcification in the aortic arch.  IMPRESSION: No acute abnormalities. Aortic  atherosclerosis.   CT head IMPRESSION: 1. No acute intracranial pathology. 2. Chronic microvascular disease and cerebral atrophy.  ____________________________________________   PROCEDURES  Procedure(s) performed: None  Critical Care performed: No  ____________________________________________   INITIAL IMPRESSION / ASSESSMENT AND PLAN / ED COURSE  Pertinent labs & imaging results that were available during my care of the patient were reviewed by me and considered in my medical decision making (see chart for details).  Victor [name] is a 69 y.o. male with atrial fibrillation on pradaxa, hypertension and hyperlipidemia presents for evaluation of syncope, palpitations, mild intermittent shortness of breath. On exam, he is nontoxic appearing and in no acute distress. Was mildly tachycardic on arrival however this has resolved at the time of my evaluation. The remainder of his examination is benign other than a small abrasion to the left knee. Concern for cardiogenic syncope last night given recent palpitations, no prodrome preceding. Additionally he has ST depressions in anteroseptal leads and mild shortness of breath which is concerning for possible atypical ACS. He also has acute kidney injury with creatinine today of 2.11. Will give light IV fluids given history of CHF. Awaiting remainder of cardiac labs, chest x-ray, CT head, anticipate admission.  ----------------------------------------- 5:15 PM on  04/05/2015 -----------------------------------------  Imaging negative for acute cardiopulmonary process or acute intracranial process. Discussed with hospitalist for admission. ____________________________________________   FINAL CLINICAL IMPRESSION(S) / ED DIAGNOSES  Final diagnoses:  Syncope, unspecified syncope type  Heart palpitations  Acute kidney injury      Gayla Doss, MD 04/05/15 1728

## 2015-04-05 NOTE — ED Notes (Addendum)
Pt reports to ED w/ c/o of tachycardia.  Pt has hx of a.Fib.  Pt sts he passed out last night.  Pt sts his HR was low this morning and that he hasn't been feeling good.  Pt denies CP.

## 2015-04-06 LAB — CBC
HCT: 35.8 % — ABNORMAL LOW (ref 40.0–52.0)
Hemoglobin: 12.2 g/dL — ABNORMAL LOW (ref 13.0–18.0)
MCH: 34 pg (ref 26.0–34.0)
MCHC: 34.1 g/dL (ref 32.0–36.0)
MCV: 99.6 fL (ref 80.0–100.0)
Platelets: 148 10*3/uL — ABNORMAL LOW (ref 150–440)
RBC: 3.59 MIL/uL — ABNORMAL LOW (ref 4.40–5.90)
RDW: 13.8 % (ref 11.5–14.5)
WBC: 6.6 10*3/uL (ref 3.8–10.6)

## 2015-04-06 LAB — BASIC METABOLIC PANEL
Anion gap: 6 (ref 5–15)
BUN: 34 mg/dL — ABNORMAL HIGH (ref 6–20)
CHLORIDE: 108 mmol/L (ref 101–111)
CO2: 27 mmol/L (ref 22–32)
Calcium: 8.1 mg/dL — ABNORMAL LOW (ref 8.9–10.3)
Creatinine, Ser: 1.73 mg/dL — ABNORMAL HIGH (ref 0.61–1.24)
GFR, EST AFRICAN AMERICAN: 45 mL/min — AB (ref >60)
GFR, EST NON AFRICAN AMERICAN: 39 mL/min — AB (ref >60)
Glucose, Bld: 104 mg/dL — ABNORMAL HIGH (ref 65–99)
Potassium: 3.3 mmol/L — ABNORMAL LOW (ref 3.5–5.1)
Sodium: 141 mmol/L (ref 135–145)

## 2015-04-06 LAB — GLUCOSE, CAPILLARY
GLUCOSE-CAPILLARY: 92 mg/dL (ref 65–99)
GLUCOSE-CAPILLARY: 172 mg/dL — AB (ref 65–99)
Glucose-Capillary: 230 mg/dL — ABNORMAL HIGH (ref 65–99)

## 2015-04-06 LAB — TROPONIN I: Troponin I: <0.03 ng/mL (ref <0.031)

## 2015-04-06 NOTE — Progress Notes (Signed)
Patient admitted to this unit from ED, alert and oriented, denies any pain, VSS mood calm, troponin  negative x3, no episode of dizziness or syncope at this time.

## 2015-04-06 NOTE — Progress Notes (Signed)
[facility] - [place] at [facility]   PATIENT NAME: Victor [name]    MR#:  [id number]  DATE OF BIRTH:  01/16/1946  SUBJECTIVE:  CHIEF COMPLAINT:   Chief Complaint  Patient presents with  . Weakness   Patient here due to a syncopal episode and also noted to be in rapid atrial fibrillation with RVR. No further episodes of syncope overnight. No pauses. Remains in A. fib but rate controlled.  REVIEW OF SYSTEMS:    Review of Systems  Constitutional: Negative for fever and chills.  HENT: Negative for congestion and tinnitus.   Eyes: Negative for blurred vision and double vision.  Respiratory: Negative for cough, shortness of breath and wheezing.   Cardiovascular: Negative for chest pain, orthopnea and PND.  Gastrointestinal: Negative for nausea, vomiting, abdominal pain and diarrhea.  Genitourinary: Negative for dysuria and hematuria.  Neurological: Negative for dizziness, sensory change and focal weakness.  All other systems reviewed and are negative.   Nutrition: Heart healthy Tolerating Diet: Yes Tolerating PT: Ambulatory      DRUG ALLERGIES:  No Known Allergies  VITALS:  Blood pressure 127/75, pulse 90, temperature 98.1 F (36.7 C), temperature source Oral, resp. rate 21, height 6' (1.829 m), weight 80.377 kg (177 lb 3.2 oz), SpO2 99 %.  PHYSICAL EXAMINATION:   Physical Exam  GENERAL:  69 y.o.-year-old patient lying in the bed with no acute distress.  EYES: Pupils equal, round, reactive to light and accommodation. No scleral icterus. Extraocular muscles intact.  HEENT: Head atraumatic, normocephalic. Oropharynx and nasopharynx clear.  NECK:  Supple, no jugular venous distention. No thyroid enlargement, no tenderness.  LUNGS: Normal breath sounds bilaterally, no wheezing, rales, rhonchi. No use of accessory muscles of respiration.  CARDIOVASCULAR: S1, S2 Irregular rate. No murmurs, rubs, or gallops.  ABDOMEN: Soft, nontender, nondistended. Bowel  sounds present. No organomegaly or mass.  EXTREMITIES: No cyanosis, clubbing or edema b/l.    NEUROLOGIC: Cranial nerves II through XII are intact. No focal Motor or sensory deficits b/l.   PSYCHIATRIC: The patient is alert and oriented x 3. Good affect SKIN: No obvious rash, lesion, or ulcer.    LABORATORY PANEL:   CBC  Recent Labs Lab 04/06/15 0259  WBC 6.6  HGB 12.2*  HCT 35.8*  PLT 148*   ------------------------------------------------------------------------------------------------------------------  Chemistries   Recent Labs Lab 04/06/15 0259  NA 141  K 3.3*  CL 108  CO2 27  GLUCOSE 104*  BUN 34*  CREATININE 1.73*  CALCIUM 8.1*   ------------------------------------------------------------------------------------------------------------------  Cardiac Enzymes  Recent Labs Lab 04/06/15 0259  TROPONINI <0.03   ------------------------------------------------------------------------------------------------------------------  RADIOLOGY:  Dg Chest 2 View  04/05/2015   CLINICAL DATA:  Syncope.  EXAM: CHEST  2 VIEW  COMPARISON:  11/07/2014  FINDINGS: Heart size and pulmonary vascularity are normal and the lungs are clear. No effusions. Are slight thoracolumbar scoliosis. Calcification in the aortic arch.  IMPRESSION: No acute abnormalities.  Aortic atherosclerosis.   Electronically Signed   By: Francene Boyers M.D.   On: 04/05/2015 16:23   Ct Head Wo Contrast  04/05/2015   CLINICAL DATA:  Syncope, does not feel good, passed out last night  EXAM: CT HEAD WITHOUT CONTRAST  TECHNIQUE: Contiguous axial images were obtained from the base of the skull through the vertex without intravenous contrast.  COMPARISON:  None  FINDINGS: There is no evidence of mass effect, midline shift, or extra-axial fluid collections. There is no evidence of a space-occupying lesion or intracranial hemorrhage. There  is no evidence of a cortical-based area of acute infarction. There is  generalized cerebral atrophy. There is periventricular white matter low attenuation likely secondary to microangiopathy.  The ventricles and sulci are appropriate for the patient's age. The basal cisterns are patent.  Visualized portions of the orbits are unremarkable. The visualized portions of the paranasal sinuses and mastoid air cells are unremarkable. Cerebrovascular atherosclerotic calcifications are noted.  The osseous structures are unremarkable.  IMPRESSION: 1. No acute intracranial pathology. 2. Chronic microvascular disease and cerebral atrophy.   Electronically Signed   By: Elige Ko   On: 04/05/2015 16:18     ASSESSMENT AND PLAN:   69 year old male with past medical history of chronic afibrillation, diabetes without complication, hypertension, hyperlipidemia, history of systolic CHF, who presented to the hospital with a syncopal episode and noted to be in rapid atrial fibrillation.  #1 syncope-this was likely secondary to patient's rapid afibrillation. Patient has CT head on admission showing no acute pathology. -Patient has had no evidence of pauses or any arrhythmia on telemetry. -Patient was noted to have acute renal failure and possibly dehydrated and therefore we'll check orthostatic vital signs.  #2 atrial fibrillation with rapid ventricular response-patient presented with heart rates in the 120s to 130s. -Is clinically much improved and stable now with IV bolus Cardizem. His heart rate is a bit on the bradycardic side. -Continue Coreg, continue Pradaxa.  #3 acute renal failure-likely secondary to dehydration. Continue gentle IV fluids and BUN and creatinine are improving. -I do not have a previous baseline creatinine to compare with.  #4 diabetes without complication-continue glipizide, Lantus, sliding scale insulin. Next  #5 hyperlipidemia-continue simvastatin.  #6 hypokalemia-continue potassium supplementation.    All the records are reviewed and case discussed  with Care Management/Social Workerr. Management plans discussed with the patient, family and they are in agreement.  CODE STATUS: Full  DVT Prophylaxis: Pradaxa  TOTAL TIME TAKING CARE OF THIS PATIENT: 30 minutes.   POSSIBLE D/C tomorrow morning if renal function improving and no further episodes of syncope or A. fib with RVR.   Houston Siren M.D on 04/06/2015 at 1:27 PM  Between 7am to 6pm - Pager - [phone]  After 6pm go to [email or web address] - password [email or web address]  [facility]  Office  [phone]  CC: Primary care physician; Konrad Felix, NP

## 2015-04-06 NOTE — Progress Notes (Signed)
Skin assessment completed , verified with cristal beasly, RN

## 2015-04-06 NOTE — Progress Notes (Signed)
Per pt request insulin and glipizide rescheduled to 8pm as pt says he normally takes meds between 7-8 at home and would like to remain on the same schedule. Pt has refused SS insulin throughout the day but has had no other complaints.

## 2015-04-06 NOTE — Care Management Note (Signed)
Case Management Note  Patient Details  Name: THUAN TIPPETT MRN: [id number] Date of Birth: 06/17/1946  Subjective/Objective:   This Clinical research associate was notified by [facility] that Mr Deadwyler has [employer]. A referral was faxed to the [facility] and Mr Deguire will be notified if a [facility] bed is available and be given the choice to accept transfer to a VA bed or to refuse it.                   Action/Plan:   Expected Discharge Date:                  Expected Discharge Plan:     In-House Referral:     Discharge planning Services     Post Acute Care Choice:    Choice offered to:     DME Arranged:    DME Agency:     HH Arranged:    HH Agency:     Status of Service:     Medicare Important Message Given:    Date Medicare IM Given:    Medicare IM give by:    Date Additional Medicare IM Given:    Additional Medicare Important Message give by:     If discussed at Long Length of Stay Meetings, dates discussed:    Additional Comments:  Ravonda Brecheen A, RN 04/06/2015, 1:44 PM

## 2015-04-06 NOTE — Progress Notes (Signed)
Notified Ileene Rubens ,RN this patient has veterans insurance.

## 2015-04-07 LAB — BASIC METABOLIC PANEL
Anion gap: 6 (ref 5–15)
BUN: 22 mg/dL — AB (ref 6–20)
CALCIUM: 8.2 mg/dL — AB (ref 8.9–10.3)
CO2: 28 mmol/L (ref 22–32)
Chloride: 109 mmol/L (ref 101–111)
Creatinine, Ser: 1.57 mg/dL — ABNORMAL HIGH (ref 0.61–1.24)
GFR, EST AFRICAN AMERICAN: 51 mL/min — AB (ref >60)
GFR, EST NON AFRICAN AMERICAN: 44 mL/min — AB (ref >60)
Glucose, Bld: 89 mg/dL (ref 65–99)
POTASSIUM: 3.1 mmol/L — AB (ref 3.5–5.1)
Sodium: 143 mmol/L (ref 135–145)

## 2015-04-07 LAB — GLUCOSE, CAPILLARY: GLUCOSE-CAPILLARY: 130 mg/dL — AB (ref 65–99)

## 2015-04-07 MED ORDER — DILTIAZEM HCL 30 MG PO TABS
30.0000 mg | ORAL_TABLET | Freq: Three times a day (TID) | ORAL | Status: DC | PRN
Start: 2015-04-07 — End: 2017-02-23

## 2015-04-07 NOTE — Progress Notes (Signed)
Pt A&O, VSS with no complaints of pain or discomfort. Discharge instructions and prescription given to pt with verbal acknowledgment of understanding. IV and tele removed and pt escorted of unit via wheelchair by nursing.

## 2015-04-07 NOTE — Care Management Note (Signed)
Case Management Note  Patient Details  Name: Victor [name] MRN: [id number] Date of Birth: [birth year]  Subjective/Objective:  Offered Victor Victor [name] the option to transfer to the [facility] if they had a bed for him. Faxed all requested information to the [facility]. and the [facility] [place] offered Victor [name] a bed on 04/06/15. Victor [name] declined and signed a refusal of transfer to the [place] form which was filed in his chart.                  Action/Plan:   Expected Discharge Date:                  Expected Discharge Plan:     In-House Referral:     Discharge planning Services     Post Acute Care Choice:    Choice offered to:     DME Arranged:    DME Agency:     HH Arranged:    HH Agency:     Status of Service:     Medicare Important Message Given:    Date Medicare IM Given:    Medicare IM give by:    Date Additional Medicare IM Given:    Additional Medicare Important Message give by:     If discussed at Long Length of Stay Meetings, dates discussed:    Additional Comments:  Julietta Batterman A, RN 04/07/2015, 9:19 AM

## 2015-04-07 NOTE — Discharge Summary (Signed)
[facility] [place] at [facility]   PATIENT NAME: Victor [name]    MR#:  [id number]  DATE OF BIRTH:  20-Apr-1946  DATE OF ADMISSION:  04/05/2015 ADMITTING PHYSICIAN: Milagros Loll, MD  DATE OF DISCHARGE: 04/07/2015 11:01 AM  PRIMARY CARE PHYSICIAN: Konrad Felix, NP    ADMISSION DIAGNOSIS:  Heart palpitations [R00.2] Acute kidney injury [N17.9] Syncope, unspecified syncope type [R55]  DISCHARGE DIAGNOSIS:  Active Problems:   A-fib   SECONDARY DIAGNOSIS:   Past Medical History  Diagnosis Date  . Atrial fibrillation   . Diabetes mellitus without complication   . Hypertension   . Hyperlipemia   . Chronic systolic CHF (congestive heart failure)     HOSPITAL COURSE:   69 year old male with past medical history of chronic afibrillation, diabetes without complication, hypertension, hyperlipidemia, history of systolic CHF, who presented to the hospital with a syncopal episode and noted to be in rapid atrial fibrillation.  #1 syncope-this was likely secondary to patient's rapid afibrillation. Patient has CT head on admission showing no acute pathology. -Patient has had no evidence of pauses or any arrhythmia on telemetry. -Orthostatic vital signs were negative. Patient has had no further evidence of syncope while in the hospital.  #2 atrial fibrillation with rapid ventricular response-patient presented with heart rates in the 120s to 130s. -Patient received some boluses of IV Cardizem and has improved since then. His heart rate actually fluctuates and becomes bradycardic into the 60s to 50s at times. -Advised this time patient will be discharged on his schedule Coreg and as needed Cardizem and his heart rates were to go above 120 and he was symptomatic. Patient will continue follow-up with his cardiology at the [facility] -Patient will continue Pradaxa.  #3 acute renal failure-likely secondary to dehydration. Patient was given some gentle IV fluids and  his creatinine has improved. -We do not have a baseline creatinine to compare with the patient should follow-up with his primary care physician at the [facility].  #4 diabetes without complication-continue glipizide, Lantus  #5 hyperlipidemia-continue simvastatin.  #6 hypokalemia-this has improved with supplementation and patient will continue take his potassium supplements.  DISCHARGE CONDITIONS:   Stable  CONSULTS OBTAINED:     DRUG ALLERGIES:  No Known Allergies  DISCHARGE MEDICATIONS:   Discharge Medication List as of 04/07/2015 10:33 AM    START taking these medications   Details  diltiazem (CARDIZEM) 30 MG tablet Take 1 tablet (30 mg total) by mouth 3 (three) times daily as needed (For HR > 120)., Starting 04/07/2015, Until Discontinued, Print      CONTINUE these medications which have NOT CHANGED   Details  allopurinol (ZYLOPRIM) 100 MG tablet Take 200 mg by mouth daily., Until Discontinued, Historical Med    ammonium lactate (AMLACTIN) 12 % cream Apply 1 g topically as needed for dry skin., Until Discontinued, Historical Med    buPROPion (WELLBUTRIN SR) 150 MG 12 hr tablet Take 150 mg by mouth 2 (two) times daily., Until Discontinued, Historical Med    carvedilol (COREG) 25 MG tablet Take 25 mg by mouth every 12 (twelve) hours., Until Discontinued, Historical Med    chlorthalidone (HYGROTON) 50 MG tablet Take 50 mg by mouth daily., Until Discontinued, Historical Med    dabigatran (PRADAXA) 150 MG CAPS capsule Take 150 mg by mouth every 12 (twelve) hours., Until Discontinued, Historical Med    glipiZIDE (GLUCOTROL) 10 MG tablet Take 20 mg by mouth 2 (two) times daily., Until Discontinued, Historical Med  insulin glargine (LANTUS) 100 UNIT/ML injection Inject 14 Units into the skin every evening., Until Discontinued, Historical Med    lisinopril (PRINIVIL,ZESTRIL) 20 MG tablet Take 10 mg by mouth daily., Until Discontinued, Historical Med    potassium chloride SA  (K-DUR,KLOR-CON) 20 MEQ tablet Take 40 mEq by mouth daily., Until Discontinued, Historical Med    simvastatin (ZOCOR) 20 MG tablet Take 10 mg by mouth at bedtime., Until Discontinued, Historical Med         DISCHARGE INSTRUCTIONS:   DIET:  Cardiac diet and Diabetic diet  DISCHARGE CONDITION:  Stable  ACTIVITY:  Activity as tolerated  OXYGEN:  Home Oxygen: No.   Oxygen Delivery: room air  DISCHARGE LOCATION:  home   If you experience worsening of your admission symptoms, develop shortness of breath, life threatening emergency, suicidal or homicidal thoughts you must seek medical attention immediately by calling 911 or calling your MD immediately  if symptoms less severe.  You Must read complete instructions/literature along with all the possible adverse reactions/side effects for all the Medicines you take and that have been prescribed to you. Take any new Medicines after you have completely understood and accpet all the possible adverse reactions/side effects.   Please note  You were cared for by a hospitalist during your hospital stay. If you have any questions about your discharge medications or the care you received while you were in the hospital after you are discharged, you can call the unit and asked to speak with the hospitalist on call if the hospitalist that took care of you is not available. Once you are discharged, your primary care physician will handle any further medical issues. Please note that NO REFILLS for any discharge medications will be authorized once you are discharged, as it is imperative that you return to your primary care physician (or establish a relationship with a primary care physician if you do not have one) for your aftercare needs so that they can reassess your need for medications and monitor your lab values.     Today   No further episodes of syncope. Remains in A. fib but rate controlled. No complaints presently. Renal function is  improved.  VITAL SIGNS:  Blood pressure 159/82, pulse 100, temperature 98.4 F (36.9 C), temperature source Oral, resp. rate 16, height 6' (1.829 m), weight 81.012 kg (178 lb 9.6 oz), SpO2 100 %.  I/O:   Intake/Output Summary (Last 24 hours) at 04/07/15 1340 Last data filed at 04/07/15 0845  Gross per 24 hour  Intake    480 ml  Output   2025 ml  Net  -1545 ml    PHYSICAL EXAMINATION:  GENERAL:  69 y.o.-year-old patient lying in the bed with no acute distress.  EYES: Pupils equal, round, reactive to light and accommodation. No scleral icterus. Extraocular muscles intact.  HEENT: Head atraumatic, normocephalic. Oropharynx and nasopharynx clear.  NECK:  Supple, no jugular venous distention. No thyroid enlargement, no tenderness.  LUNGS: Normal breath sounds bilaterally, no wheezing, rales,rhonchi. No use of accessory muscles of respiration.  CARDIOVASCULAR: S1, S2 Irregular. No murmurs, rubs, or gallops.  ABDOMEN: Soft, non-tender, non-distended. Bowel sounds present. No organomegaly or mass.  EXTREMITIES: No pedal edema, cyanosis, or clubbing.  NEUROLOGIC: Cranial nerves II through XII are intact. No focal motor or sensory defecits b/l.  PSYCHIATRIC: The patient is alert and oriented x 3. Good affect.  SKIN: No obvious rash, lesion, or ulcer.   DATA REVIEW:   CBC  Recent Labs Lab 04/06/15  0259  WBC 6.6  HGB 12.2*  HCT 35.8*  PLT 148*    Chemistries   Recent Labs Lab 04/07/15 0428  NA 143  K 3.1*  CL 109  CO2 28  GLUCOSE 89  BUN 22*  CREATININE 1.57*  CALCIUM 8.2*    Cardiac Enzymes  Recent Labs Lab 04/06/15 0259  TROPONINI <0.03    Microbiology Results  No results found for this or any previous visit.  RADIOLOGY:  Dg Chest 2 View  04/05/2015   CLINICAL DATA:  Syncope.  EXAM: CHEST  2 VIEW  COMPARISON:  11/07/2014  FINDINGS: Heart size and pulmonary vascularity are normal and the lungs are clear. No effusions. Are slight thoracolumbar scoliosis.  Calcification in the aortic arch.  IMPRESSION: No acute abnormalities.  Aortic atherosclerosis.   Electronically Signed   By: Francene Boyers M.D.   On: 04/05/2015 16:23   Ct Head Wo Contrast  04/05/2015   CLINICAL DATA:  Syncope, does not feel good, passed out last night  EXAM: CT HEAD WITHOUT CONTRAST  TECHNIQUE: Contiguous axial images were obtained from the base of the skull through the vertex without intravenous contrast.  COMPARISON:  None  FINDINGS: There is no evidence of mass effect, midline shift, or extra-axial fluid collections. There is no evidence of a space-occupying lesion or intracranial hemorrhage. There is no evidence of a cortical-based area of acute infarction. There is generalized cerebral atrophy. There is periventricular white matter low attenuation likely secondary to microangiopathy.  The ventricles and sulci are appropriate for the patient's age. The basal cisterns are patent.  Visualized portions of the orbits are unremarkable. The visualized portions of the paranasal sinuses and mastoid air cells are unremarkable. Cerebrovascular atherosclerotic calcifications are noted.  The osseous structures are unremarkable.  IMPRESSION: 1. No acute intracranial pathology. 2. Chronic microvascular disease and cerebral atrophy.   Electronically Signed   By: Elige Ko   On: 04/05/2015 16:18      Management plans discussed with the patient, family and they are in agreement.  CODE STATUS:     Code Status Orders        Start     Ordered   04/05/15 1728  Full code   Continuous     04/05/15 1730    Advance Directive Documentation        Most Recent Value   Type of Advance Directive  Living will   Pre-existing out of facility DNR order (yellow form or pink MOST form)     "MOST" Form in Place?        TOTAL TIME TAKING CARE OF THIS PATIENT: 40 minutes.    Houston Siren M.D on 04/07/2015 at 1:40 PM  Between 7am to 6pm - Pager - [phone]  After 6pm go to [email or web address] -  password [email or web address] [facility]  [facility] [place] [facility]  Office  [phone]  CC: Primary care physician; Konrad Felix, NP

## 2015-04-07 NOTE — Progress Notes (Signed)
q shift BP taken by nurse aide. BP elevated. MD notified. Acknowledged. No further orders given. Will continue to monitor.

## 2015-04-07 NOTE — Discharge Instructions (Signed)

## 2015-10-24 ENCOUNTER — Observation Stay
Admission: EM | Admit: 2015-10-24 | Discharge: 2015-10-26 | Disposition: A | Discharge status: Home / Self Care | Payer: Medicare Other | Attending: Internal Medicine | Admitting: Internal Medicine

## 2015-10-24 ENCOUNTER — Encounter: Payer: Self-pay | Admitting: Medical Oncology

## 2015-10-24 ENCOUNTER — Emergency Department: Payer: Medicare Other

## 2015-10-24 DIAGNOSIS — I739 Peripheral vascular disease, unspecified: Secondary | ICD-10-CM | POA: Insufficient documentation

## 2015-10-24 DIAGNOSIS — Z79899 Other long term (current) drug therapy: Secondary | ICD-10-CM | POA: Diagnosis not present

## 2015-10-24 DIAGNOSIS — Z87891 Personal history of nicotine dependence: Secondary | ICD-10-CM | POA: Insufficient documentation

## 2015-10-24 DIAGNOSIS — Z794 Long term (current) use of insulin: Secondary | ICD-10-CM | POA: Insufficient documentation

## 2015-10-24 DIAGNOSIS — E1129 Type 2 diabetes mellitus with other diabetic kidney complication: Secondary | ICD-10-CM | POA: Diagnosis not present

## 2015-10-24 DIAGNOSIS — I482 Chronic atrial fibrillation: Secondary | ICD-10-CM | POA: Insufficient documentation

## 2015-10-24 DIAGNOSIS — E119 Type 2 diabetes mellitus without complications: Secondary | ICD-10-CM

## 2015-10-24 DIAGNOSIS — N289 Disorder of kidney and ureter, unspecified: Secondary | ICD-10-CM | POA: Diagnosis not present

## 2015-10-24 DIAGNOSIS — R4182 Altered mental status, unspecified: Secondary | ICD-10-CM | POA: Diagnosis not present

## 2015-10-24 DIAGNOSIS — I159 Secondary hypertension, unspecified: Secondary | ICD-10-CM

## 2015-10-24 DIAGNOSIS — I5022 Chronic systolic (congestive) heart failure: Secondary | ICD-10-CM | POA: Insufficient documentation

## 2015-10-24 DIAGNOSIS — I110 Hypertensive heart disease with heart failure: Secondary | ICD-10-CM | POA: Diagnosis not present

## 2015-10-24 DIAGNOSIS — R569 Unspecified convulsions: Principal | ICD-10-CM

## 2015-10-24 DIAGNOSIS — I611 Nontraumatic intracerebral hemorrhage in hemisphere, cortical: Secondary | ICD-10-CM | POA: Insufficient documentation

## 2015-10-24 DIAGNOSIS — I4891 Unspecified atrial fibrillation: Secondary | ICD-10-CM

## 2015-10-24 DIAGNOSIS — E785 Hyperlipidemia, unspecified: Secondary | ICD-10-CM | POA: Insufficient documentation

## 2015-10-24 DIAGNOSIS — Z7901 Long term (current) use of anticoagulants: Secondary | ICD-10-CM | POA: Diagnosis not present

## 2015-10-24 LAB — CBC WITH DIFFERENTIAL/PLATELET
BASOS PCT: 1 %
Basophils Absolute: 0.1 10*3/uL (ref 0–0.1)
Eosinophils Absolute: 0 10*3/uL (ref 0–0.7)
Eosinophils Relative: 0 %
HCT: 37.5 % — ABNORMAL LOW (ref 40.0–52.0)
HEMOGLOBIN: 12.4 g/dL — AB (ref 13.0–18.0)
Lymphocytes Relative: 2 %
Lymphs Abs: 0.3 10*3/uL — ABNORMAL LOW (ref 1.0–3.6)
MCH: 31.3 pg (ref 26.0–34.0)
MCHC: 33 g/dL (ref 32.0–36.0)
MCV: 94.6 fL (ref 80.0–100.0)
Monocytes Absolute: 0.3 10*3/uL (ref 0.2–1.0)
Monocytes Relative: 3 %
NEUTROS PCT: 94 %
Neutro Abs: 11.7 10*3/uL — ABNORMAL HIGH (ref 1.4–6.5)
Platelets: 169 10*3/uL (ref 150–440)
RBC: 3.96 MIL/uL — ABNORMAL LOW (ref 4.40–5.90)
RDW: 15.2 % — ABNORMAL HIGH (ref 11.5–14.5)
WBC: 12.4 10*3/uL — ABNORMAL HIGH (ref 3.8–10.6)

## 2015-10-24 LAB — BASIC METABOLIC PANEL
Anion gap: 21 — ABNORMAL HIGH (ref 5–15)
BUN: 28 mg/dL — ABNORMAL HIGH (ref 6–20)
CALCIUM: 8.6 mg/dL — AB (ref 8.9–10.3)
CO2: 13 mmol/L — ABNORMAL LOW (ref 22–32)
CREATININE: 2.04 mg/dL — AB (ref 0.61–1.24)
Chloride: 105 mmol/L (ref 101–111)
GFR calc non Af Amer: 32 mL/min — ABNORMAL LOW (ref >60)
GFR, EST AFRICAN AMERICAN: 37 mL/min — AB (ref >60)
Glucose, Bld: 252 mg/dL — ABNORMAL HIGH (ref 65–99)
Potassium: 4.7 mmol/L (ref 3.5–5.1)
Sodium: 139 mmol/L (ref 135–145)

## 2015-10-24 MED ORDER — SODIUM CHLORIDE 0.9 % IV SOLN
1000.0000 mg | Freq: Once | INTRAVENOUS | Status: AC
  Administered 2015-10-24: 1000 mg via INTRAVENOUS
  Filled 2015-10-24: qty 10

## 2015-10-24 NOTE — ED Notes (Signed)
Pt now is oriented to self, Pt still confused.

## 2015-10-24 NOTE — ED Notes (Signed)
Pt provided a gown per request

## 2015-10-24 NOTE — ED Provider Notes (Signed)
[facility] Emergency Department Provider Note   ____________________________________________  Time seen: On EMS arrival  I have reviewed the triage vital signs and the nursing notes.   HISTORY  Chief Complaint Altered Mental Status and Seizures   History limited by: Altered Mental Status   HPI Victor [name] is a 70 y.o. male who presents to the emergency department via EMS today because of seizures. The patient is not able to give any history. Per EMS the patient was on his phone with one of his family members when they thought he was starting to have a seizure. When EMS arrived at the household that had to break into the resident's. They found the patient. He did have a seizure for EMS and they administered 1 mg of Versed. He was slightly verbal at one point for EMS however would only repeat the word 10.    Past Medical History  Diagnosis Date  . Atrial fibrillation   . Diabetes mellitus without complication   . Hypertension   . Hyperlipemia   . Chronic systolic CHF (congestive heart failure)     Patient Active Problem List   Diagnosis Date Noted  . A-fib (HCC) 04/05/2015    Past Surgical History  Procedure Laterality Date  . Tonsillectomy      Current Outpatient Rx  Name  Route  Sig  Dispense  Refill  . allopurinol (ZYLOPRIM) 100 MG tablet   Oral   Take 200 mg by mouth daily.         Marland Kitchen ammonium lactate (AMLACTIN) 12 % cream   Topical   Apply 1 g topically as needed for dry skin.         Marland Kitchen buPROPion (WELLBUTRIN SR) 150 MG 12 hr tablet   Oral   Take 150 mg by mouth 2 (two) times daily.         . carvedilol (COREG) 25 MG tablet   Oral   Take 25 mg by mouth every 12 (twelve) hours.         . chlorthalidone (HYGROTON) 50 MG tablet   Oral   Take 50 mg by mouth daily.         . dabigatran (PRADAXA) 150 MG CAPS capsule   Oral   Take 150 mg by mouth every 12 (twelve) hours.         Marland Kitchen diltiazem (CARDIZEM) 30 MG tablet  Oral   Take 1 tablet (30 mg total) by mouth 3 (three) times daily as needed (For HR > 120).   60 tablet   1   . glipiZIDE (GLUCOTROL) 10 MG tablet   Oral   Take 20 mg by mouth 2 (two) times daily.         . insulin glargine (LANTUS) 100 UNIT/ML injection   Subcutaneous   Inject 14 Units into the skin every evening.         Marland Kitchen lisinopril (PRINIVIL,ZESTRIL) 20 MG tablet   Oral   Take 10 mg by mouth daily.         . potassium chloride SA (K-DUR,KLOR-CON) 20 MEQ tablet   Oral   Take 40 mEq by mouth daily.         . simvastatin (ZOCOR) 20 MG tablet   Oral   Take 10 mg by mouth at bedtime.           Allergies Review of patient's allergies indicates no known allergies.  Family History  Problem Relation Age of Onset  . Other Other  Social History Social History  Substance Use Topics  . Smoking status: Former Games developer  . Smokeless tobacco: Not on file  . Alcohol Use: No    Review of Systems Unable to obtain secondary to altered mental status.  ____________________________________________   PHYSICAL EXAM:  VITAL SIGNS:    95  14   144/68 mmHg  90 %     Constitutional: Awake, looking around, non verbal Eyes: Slight gaze preference to the left ENT   Head: Normocephalic and atraumatic.   Nose: No congestion/rhinnorhea.   Mouth/Throat: Mucous membranes are moist.   Neck: No stridor. Hematological/Lymphatic/Immunilogical: No cervical lymphadenopathy. Cardiovascular: Normal rate, regular rhythm.  No murmurs, rubs, or gallops. Respiratory: Normal respiratory effort without tachypnea nor retractions. Breath sounds are clear and equal bilaterally. No wheezes/rales/rhonchi. Gastrointestinal: Soft and nontender. No distention.  Genitourinary: Deferred Musculoskeletal: Normal range of motion in all extremities. No joint effusions.  No lower extremity tenderness nor edema. Neurologic:  Awake, alert, non verbal, gaze preference to the left.  Skin:   Skin is warm, dry and intact. No rash noted.  ____________________________________________    LABS (pertinent positives/negatives)  WBC 12.4 Hgb 12.4 Plt 169 Na 139 K 4.7  ____________________________________________   EKG  I, Phineas Semen, attending physician, personally viewed and interpreted this EKG  EKG Time: 1839 Rate: 97 Rhythm: sinus rhythm with 1st degree av block Axis: normal Intervals: qtc 384, 1st degree av block QRS: narrow ST changes: no st elevation Impression: abnormal ekg   ____________________________________________    RADIOLOGY  CT head IMPRESSION: 1. No acute finding. 2. Chronic small vessel disease and atrophy that is stable from prior.  I, Phineas Semen, personally discussed these images and results by phone with the on-call radiologist and used this discussion as part of my medical decision making.   ____________________________________________   PROCEDURES  Procedure(s) performed: None  Critical Care performed: No  ____________________________________________   INITIAL IMPRESSION / ASSESSMENT AND PLAN / ED COURSE  Pertinent labs & imaging results that were available during my care of the patient were reviewed by me and considered in my medical decision making (see chart for details).  Patient presented to the ER because of seizure. Brought by EMS. On initial presentation patient post ictal, however came close to baseline during stay in ED. Patient did have a prescription for keppra filled last month but denied any history of seizure. Have filled out paperwork for patient to be admitted to [facility].   ____________________________________________   FINAL CLINICAL IMPRESSION(S) / ED DIAGNOSES  Seizure  Phineas Semen, MD 10/25/15 1537

## 2015-10-24 NOTE — ED Notes (Signed)
Patient denies pain and is resting comfortably.  

## 2015-10-24 NOTE — ED Notes (Signed)
Pt transported to and from CT scan on monitor with nurse.

## 2015-10-24 NOTE — ED Notes (Signed)
[name] [phone] POA

## 2015-10-24 NOTE — ED Notes (Signed)
Pt is now oriented to person, time, and place, pt denies pain or any needs at this time

## 2015-10-24 NOTE — ED Notes (Signed)
Pt from home via ems- 911 was called by pts daughter, she was on the phone with patient when it sounded like he was stuttering and possibly having a seizure. When ems arrived pt was confused and began to stutter words again as was repetitive with words. Then pt began having tonic clonic type seizure with ems x 2.  Versed IV was administered pta.

## 2015-10-25 ENCOUNTER — Encounter: Payer: Self-pay | Admitting: Internal Medicine

## 2015-10-25 ENCOUNTER — Observation Stay: Payer: Medicare Other

## 2015-10-25 DIAGNOSIS — R569 Unspecified convulsions: Secondary | ICD-10-CM

## 2015-10-25 LAB — BASIC METABOLIC PANEL
Anion gap: 9 (ref 5–15)
BUN: 27 mg/dL — AB (ref 6–20)
CO2: 24 mmol/L (ref 22–32)
CREATININE: 1.73 mg/dL — AB (ref 0.61–1.24)
Calcium: 8 mg/dL — ABNORMAL LOW (ref 8.9–10.3)
Chloride: 105 mmol/L (ref 101–111)
GFR, EST AFRICAN AMERICAN: 45 mL/min — AB (ref >60)
GFR, EST NON AFRICAN AMERICAN: 39 mL/min — AB (ref >60)
Glucose, Bld: 202 mg/dL — ABNORMAL HIGH (ref 65–99)
POTASSIUM: 3.6 mmol/L (ref 3.5–5.1)
SODIUM: 138 mmol/L (ref 135–145)

## 2015-10-25 LAB — PHOSPHORUS: PHOSPHORUS: 4 mg/dL (ref 2.5–4.6)

## 2015-10-25 LAB — CBC
HCT: 32.3 % — ABNORMAL LOW (ref 40.0–52.0)
Hemoglobin: 10.8 g/dL — ABNORMAL LOW (ref 13.0–18.0)
MCH: 31.9 pg (ref 26.0–34.0)
MCHC: 33.4 g/dL (ref 32.0–36.0)
MCV: 95.7 fL (ref 80.0–100.0)
Platelets: 135 10*3/uL — ABNORMAL LOW (ref 150–440)
RBC: 3.37 MIL/uL — ABNORMAL LOW (ref 4.40–5.90)
RDW: 15 % — AB (ref 11.5–14.5)
WBC: 7.7 10*3/uL (ref 3.8–10.6)

## 2015-10-25 LAB — GLUCOSE, CAPILLARY
GLUCOSE-CAPILLARY: 149 mg/dL — AB (ref 65–99)
Glucose-Capillary: 135 mg/dL — ABNORMAL HIGH (ref 65–99)

## 2015-10-25 LAB — URINALYSIS COMPLETE WITH MICROSCOPIC (ARMC ONLY)
Bilirubin Urine: NEGATIVE
Glucose, UA: NEGATIVE mg/dL
Hgb urine dipstick: NEGATIVE
Leukocytes, UA: NEGATIVE
Nitrite: NEGATIVE
Protein, ur: NEGATIVE mg/dL
Specific Gravity, Urine: 1.013 (ref 1.005–1.030)
Squamous Epithelial / LPF: NONE SEEN
pH: 6 (ref 5.0–8.0)

## 2015-10-25 LAB — TROPONIN I: Troponin I: <0.03 ng/mL (ref <0.031)

## 2015-10-25 LAB — MAGNESIUM: Magnesium: 2.5 mg/dL — ABNORMAL HIGH (ref 1.7–2.4)

## 2015-10-25 MED ORDER — SODIUM CHLORIDE 0.9% FLUSH: 3.0000 mL | INTRAVENOUS | Status: DC | PRN

## 2015-10-25 MED ORDER — APIXABAN 5 MG PO TABS
5.0000 mg | ORAL_TABLET | Freq: Two times a day (BID) | ORAL | Status: DC
  Administered 2015-10-25 – 2015-10-26 (×3): 5 mg via ORAL
  Filled 2015-10-25 (×3): qty 1

## 2015-10-25 MED ORDER — BUPROPION HCL ER (SR) 150 MG PO TB12: 150.0000 mg | ORAL_TABLET | Freq: Two times a day (BID) | ORAL | Status: DC

## 2015-10-25 MED ORDER — ONDANSETRON HCL 4 MG/2ML IJ SOLN: 4.0000 mg | Freq: Four times a day (QID) | INTRAMUSCULAR | Status: DC | PRN

## 2015-10-25 MED ORDER — ALLOPURINOL 100 MG PO TABS
200.0000 mg | ORAL_TABLET | Freq: Every day | ORAL | Status: DC
  Administered 2015-10-25 – 2015-10-26 (×2): 200 mg via ORAL
  Filled 2015-10-25 (×2): qty 2

## 2015-10-25 MED ORDER — SIMVASTATIN 20 MG PO TABS
10.0000 mg | ORAL_TABLET | Freq: Every day | ORAL | Status: DC
  Administered 2015-10-25: 10 mg via ORAL
  Filled 2015-10-25: qty 2

## 2015-10-25 MED ORDER — AMLODIPINE BESYLATE 10 MG PO TABS
10.0000 mg | ORAL_TABLET | Freq: Every day | ORAL | Status: DC
  Administered 2015-10-25 – 2015-10-26 (×2): 10 mg via ORAL
  Filled 2015-10-25 (×2): qty 1

## 2015-10-25 MED ORDER — CARVEDILOL 25 MG PO TABS
25.0000 mg | ORAL_TABLET | Freq: Two times a day (BID) | ORAL | Status: DC
  Administered 2015-10-25 – 2015-10-26 (×3): 25 mg via ORAL
  Filled 2015-10-25 (×3): qty 1

## 2015-10-25 MED ORDER — SODIUM CHLORIDE 0.9% FLUSH
3.0000 mL | Freq: Two times a day (BID) | INTRAVENOUS | Status: DC
  Administered 2015-10-26: 3 mL via INTRAVENOUS

## 2015-10-25 MED ORDER — GLIPIZIDE 5 MG PO TABS
20.0000 mg | ORAL_TABLET | Freq: Two times a day (BID) | ORAL | Status: DC
  Administered 2015-10-25: 20 mg via ORAL
  Filled 2015-10-25: qty 4

## 2015-10-25 MED ORDER — ACETAMINOPHEN 325 MG PO TABS
650.0000 mg | ORAL_TABLET | Freq: Four times a day (QID) | ORAL | Status: DC | PRN
  Administered 2015-10-25: 650 mg via ORAL
  Filled 2015-10-25: qty 2

## 2015-10-25 MED ORDER — INSULIN ASPART 100 UNIT/ML ~~LOC~~ SOLN: 0.0000 [IU] | Freq: Three times a day (TID) | SUBCUTANEOUS | Status: DC

## 2015-10-25 MED ORDER — INSULIN GLARGINE 100 UNIT/ML ~~LOC~~ SOLN
20.0000 [IU] | Freq: Every evening | SUBCUTANEOUS | Status: DC
  Administered 2015-10-25: 20 [IU] via SUBCUTANEOUS
  Filled 2015-10-25 (×2): qty 0.2

## 2015-10-25 MED ORDER — INSULIN ASPART 100 UNIT/ML ~~LOC~~ SOLN: 0.0000 [IU] | Freq: Every day | SUBCUTANEOUS | Status: DC

## 2015-10-25 MED ORDER — INSULIN ASPART 100 UNIT/ML ~~LOC~~ SOLN
0.0000 [IU] | Freq: Three times a day (TID) | SUBCUTANEOUS | Status: DC
  Administered 2015-10-25: 1 [IU] via SUBCUTANEOUS
  Administered 2015-10-26: 5 [IU] via SUBCUTANEOUS
  Filled 2015-10-25: qty 5
  Filled 2015-10-25: qty 1

## 2015-10-25 MED ORDER — AMMONIUM LACTATE 12 % EX CREA
1.0000 g | TOPICAL_CREAM | CUTANEOUS | Status: DC | PRN
  Filled 2015-10-25: qty 140

## 2015-10-25 MED ORDER — MAGNESIUM OXIDE 400 (241.3 MG) MG PO TABS
400.0000 mg | ORAL_TABLET | Freq: Every day | ORAL | Status: DC
  Administered 2015-10-25 – 2015-10-26 (×2): 400 mg via ORAL
  Filled 2015-10-25 (×2): qty 1

## 2015-10-25 MED ORDER — LEVETIRACETAM 500 MG PO TABS
500.0000 mg | ORAL_TABLET | Freq: Two times a day (BID) | ORAL | Status: DC
  Administered 2015-10-25 – 2015-10-26 (×3): 500 mg via ORAL
  Filled 2015-10-25 (×3): qty 1

## 2015-10-25 MED ORDER — SODIUM CHLORIDE 0.9 % IV SOLN: 250.0000 mL | INTRAVENOUS | Status: DC | PRN

## 2015-10-25 MED ORDER — LISINOPRIL 10 MG PO TABS
10.0000 mg | ORAL_TABLET | Freq: Every day | ORAL | Status: DC
  Administered 2015-10-25 – 2015-10-26 (×2): 10 mg via ORAL
  Filled 2015-10-25 (×2): qty 1

## 2015-10-25 MED ORDER — SODIUM CHLORIDE 0.9% FLUSH
3.0000 mL | Freq: Two times a day (BID) | INTRAVENOUS | Status: DC
  Administered 2015-10-25 – 2015-10-26 (×3): 3 mL via INTRAVENOUS

## 2015-10-25 MED ORDER — SODIUM CHLORIDE 0.9 % IV SOLN
INTRAVENOUS | Status: DC
  Administered 2015-10-25: 05:00:00 via INTRAVENOUS

## 2015-10-25 MED ORDER — ACETAMINOPHEN 650 MG RE SUPP
650.0000 mg | Freq: Four times a day (QID) | RECTAL | Status: DC | PRN
Start: 2015-10-25 — End: 2015-10-26

## 2015-10-25 MED ORDER — DILTIAZEM HCL 30 MG PO TABS
30.0000 mg | ORAL_TABLET | Freq: Three times a day (TID) | ORAL | Status: DC | PRN
  Filled 2015-10-25: qty 1

## 2015-10-25 MED ORDER — ONDANSETRON HCL 4 MG PO TABS: 4.0000 mg | ORAL_TABLET | Freq: Four times a day (QID) | ORAL | Status: DC | PRN

## 2015-10-25 NOTE — H&P (Signed)
[facility] - [place] at [facility]   PATIENT NAME: Victor [name]    MR#:  [id number]  DATE OF BIRTH:  07/02/1946  DATE OF ADMISSION:  10/24/2015  PRIMARY CARE PHYSICIAN: Konrad Felix, NP   REQUESTING/REFERRING PHYSICIAN:   CHIEF COMPLAINT:   Chief Complaint  Patient presents with  . Altered Mental Status  . Seizures    HISTORY OF PRESENT ILLNESS: Victor [name]  is a 70 y.o. male with a known history of atrial fibrillation, diabetes mellitus, hypertension, hyperlipidemia, systolic heart failure presented to the emergency room because of confusion. Patient wasn't postictal state when he was brought to the emergency room. He lives alone and does not remember the sequence of events. ER physician noted the patient to be in postictal state and patient had a seizure. [facility]. No complaints of chest pain. No shortness of breath. No headache dizziness or blurry vision. He is more awake and an alert after he came to the emergency room. Patient takes Keppra orally but does not know whether he has seizure disorder. No documentation from his [facility]. Patient was given IV Keppra in the emergency room. No new episodes of seizures after arrival.  PAST MEDICAL HISTORY:   Past Medical History  Diagnosis Date  . Atrial fibrillation (HCC)   . Diabetes mellitus without complication (HCC)   . Hypertension   . Hyperlipemia   . Chronic systolic CHF (congestive heart failure) (HCC)     PAST SURGICAL HISTORY: Past Surgical History  Procedure Laterality Date  . Tonsillectomy      SOCIAL HISTORY:  Social History  Substance Use Topics  . Smoking status: Former Games developer  . Smokeless tobacco: Not on file  . Alcohol Use: No    FAMILY HISTORY:  Family History  Problem Relation Age of Onset  . Heart failure Mother     deceased    DRUG ALLERGIES: No Known Allergies  REVIEW OF SYSTEMS:   CONSTITUTIONAL: No fever, fatigue  or weakness.  EYES: No blurred or double vision.  EARS, NOSE, AND THROAT: No tinnitus or ear pain.  RESPIRATORY: No cough, shortness of breath, wheezing or hemoptysis.  CARDIOVASCULAR: No chest pain, orthopnea, edema.  GASTROINTESTINAL: No nausea, vomiting, diarrhea or abdominal pain.  GENITOURINARY: No dysuria, hematuria.  ENDOCRINE: No polyuria, nocturia,  HEMATOLOGY: No anemia, easy bruising or bleeding SKIN: No rash or lesion. MUSCULOSKELETAL: No joint pain or arthritis.   NEUROLOGIC: No tingling, numbness, weakness. Had seizure PSYCHIATRY: No anxiety or depression.   MEDICATIONS AT HOME:  Prior to Admission medications   Medication Sig Start Date End Date Taking? Authorizing Provider  allopurinol (ZYLOPRIM) 100 MG tablet Take 200 mg by mouth daily.   Yes Historical Provider, MD  amLODipine (NORVASC) 10 MG tablet Take 10 mg by mouth daily.   Yes Historical Provider, MD  ammonium lactate (AMLACTIN) 12 % cream Apply 1 g topically as needed for dry skin. Reported on 10/24/2015   Yes Historical Provider, MD  apixaban (ELIQUIS) 5 MG TABS tablet Take 5 mg by mouth 2 (two) times daily.   Yes Historical Provider, MD  buPROPion (WELLBUTRIN SR) 150 MG 12 hr tablet Take 150 mg by mouth 2 (two) times daily.   Yes Historical Provider, MD  carvedilol (COREG) 25 MG tablet Take 25 mg by mouth every 12 (twelve) hours.   Yes Historical Provider, MD  diltiazem (CARDIZEM) 30 MG tablet Take 1 tablet (30 mg total) by mouth  3 (three) times daily as needed (For HR > 120). 04/07/15  Yes Houston Siren, MD  glipiZIDE (GLUCOTROL) 10 MG tablet Take 20 mg by mouth 2 (two) times daily.   Yes Historical Provider, MD  insulin glargine (LANTUS) 100 UNIT/ML injection Inject 20 Units into the skin every evening.    Yes Historical Provider, MD  levETIRAcetam (KEPPRA) 500 MG tablet Take 500 mg by mouth 2 (two) times daily.   Yes Historical Provider, MD  lisinopril (PRINIVIL,ZESTRIL) 20 MG tablet Take 10 mg by mouth daily.    Yes Historical Provider, MD  magnesium oxide (MAG-OX) 400 MG tablet Take 400 mg by mouth daily.   Yes Historical Provider, MD  simvastatin (ZOCOR) 20 MG tablet Take 10 mg by mouth at bedtime.   Yes Historical Provider, MD      PHYSICAL EXAMINATION:   VITAL SIGNS: Blood pressure 164/77, pulse 100, temperature 98.2 F (36.8 C), temperature source Oral, resp. rate 16, weight 80.74 kg (178 lb), SpO2 100 %.  GENERAL:  70 y.o.-year-old patient lying in the bed with no acute distress.  EYES: Pupils equal, round, reactive to light and accommodation. No scleral icterus. Extraocular muscles intact.  HEENT: Head atraumatic, normocephalic. Oropharynx and nasopharynx clear.  NECK:  Supple, no jugular venous distention. No thyroid enlargement, no tenderness.  LUNGS: Normal breath sounds bilaterally, no wheezing, rales,rhonchi or crepitation. No use of accessory muscles of respiration.  CARDIOVASCULAR: S1, S2 normal. No murmurs, rubs, or gallops.  ABDOMEN: Soft, nontender, nondistended. Bowel sounds present. No organomegaly or mass.  EXTREMITIES: No pedal edema, cyanosis, or clubbing.  NEUROLOGIC: Cranial nerves II through XII are intact. Muscle strength 5/5 in all extremities. Sensation intact. Gait normal. PSYCHIATRIC: The patient is alert and oriented x 3.  SKIN: No obvious rash, lesion, or ulcer.   LABORATORY PANEL:   CBC  Recent Labs Lab 10/24/15 1840  WBC 12.4*  HGB 12.4*  HCT 37.5*  PLT 169  MCV 94.6  MCH 31.3  MCHC 33.0  RDW 15.2*  LYMPHSABS 0.3*  MONOABS 0.3  EOSABS 0.0  BASOSABS 0.1   ------------------------------------------------------------------------------------------------------------------  Chemistries   Recent Labs Lab 10/24/15 1840  NA 139  K 4.7  CL 105  CO2 13*  GLUCOSE 252*  BUN 28*  CREATININE 2.04*  CALCIUM 8.6*   ------------------------------------------------------------------------------------------------------------------ CrCl cannot be  calculated (Unknown ideal weight.). ------------------------------------------------------------------------------------------------------------------ No results for input(s): TSH, T4TOTAL, T3FREE, THYROIDAB in the last 72 hours.  Invalid input(s): FREET3   Coagulation profile No results for input(s): INR, PROTIME in the last 168 hours. ------------------------------------------------------------------------------------------------------------------- No results for input(s): DDIMER in the last 72 hours. -------------------------------------------------------------------------------------------------------------------  Cardiac Enzymes  Recent Labs Lab 10/24/15 1840  TROPONINI <0.03   ------------------------------------------------------------------------------------------------------------------ Invalid input(s): POCBNP  ---------------------------------------------------------------------------------------------------------------  Urinalysis    Component Value Date/Time   COLORURINE YELLOW* 04/05/2015 2245   APPEARANCEUR CLEAR* 04/05/2015 2245   LABSPEC 1.015 04/05/2015 2245   PHURINE 6.0 04/05/2015 2245   GLUCOSEU 50* 04/05/2015 2245   HGBUR NEGATIVE 04/05/2015 2245   BILIRUBINUR NEGATIVE 04/05/2015 2245   KETONESUR NEGATIVE 04/05/2015 2245   PROTEINUR 30* 04/05/2015 2245   NITRITE NEGATIVE 04/05/2015 2245   LEUKOCYTESUR NEGATIVE 04/05/2015 2245     RADIOLOGY: Ct Head Wo Contrast  10/24/2015  CLINICAL DATA:  Altered mental status with stuttering speech. Seizure EXAM: CT HEAD WITHOUT CONTRAST TECHNIQUE: Contiguous axial images were obtained from the base of the skull through the vertex without intravenous contrast. COMPARISON:  04/05/2015 FINDINGS: Skull and Sinuses:Negative for fracture or destructive  process. The visualized mastoids, middle ears, and imaged paranasal sinuses are clear. Visualized orbits: Negative. Brain: No evidence of acute infarction, hemorrhage,  hydrocephalus, or mass lesion/mass effect. Patchy low-density in the periventricular and subinsular white matter compatible with chronic small vessel disease. Generalized atrophy, moderate for age. These results were called by telephone at the time of interpretation on 10/24/2015 at 6:57 pm to Dr. Phineas Semen , who verbally acknowledged these results. IMPRESSION: 1. No acute finding. 2. Chronic small vessel disease and atrophy that is stable from prior. Electronically Signed   By: Marnee Spring M.D.   On: 10/24/2015 18:58    EKG: Orders placed or performed during the hospital encounter of 04/05/15  . ED EKG  . ED EKG  . EKG 12-Lead  . EKG 12-Lead  . EKG    IMPRESSION AND PLAN: 70 year old male patient with history of diabetes mellitus, hypertension, chronic atrial fibrillation presented to the emergency room after he had a seizure. Admitting diagnosis 1. Breakthrough seizure 2. Chronic atrial fibrillation 3. Mild renal insufficiency 4. Diabetes mellitus 5. Hypertension Treatment Plan : Admit patient to medical floor under observation bed Resume Keppra Neurology consultation Gentle IV fluid hydration Follow renal function Resume eliquis for anticoagulation.   All the records are reviewed and case discussed with ED provider. Management plans discussed with the patient, family and they are in agreement.  CODE STATUS:FULL Code Status History    Date Active Date Inactive Code Status Order ID Comments User Context   04/05/2015  5:30 PM 04/07/2015  2:11 PM Full Code [id number]  Milagros Loll, MD ED       TOTAL TIME TAKING CARE OF THIS PATIENT: .    Ihor Austin M.D on 10/25/2015 at 2:39 AM  Between 7am to 6pm - Pager - [phone]  After 6pm go to [email or web address] - password [email or web address] [facility]  [place] [facility]  Office  [phone]  CC: Primary care physician; Konrad Felix, NP

## 2015-10-25 NOTE — ED Notes (Signed)
Patient denies pain and is resting comfortably. Pt provided meal tray

## 2015-10-25 NOTE — Progress Notes (Signed)
[facility] - [place] at [facility]   PATIENT NAME: Victor [name]    MR#:  [id number]  DATE OF BIRTH:  05/21/1946  SUBJECTIVE:  CHIEF COMPLAINT:  Patient is resting comfortably. Denies any other episodes of seizures. Last drink of bourbon was to 3 days ago. Forgot to take his seizure medication yesterday. Brother at bedside  REVIEW OF SYSTEMS:  CONSTITUTIONAL: No fever, fatigue or weakness.  EYES: No blurred or double vision.  EARS, NOSE, AND THROAT: No tinnitus or ear pain.  RESPIRATORY: No cough, shortness of breath, wheezing or hemoptysis.  CARDIOVASCULAR: No chest pain, orthopnea, edema.  GASTROINTESTINAL: No nausea, vomiting, diarrhea or abdominal pain.  GENITOURINARY: No dysuria, hematuria.  ENDOCRINE: No polyuria, nocturia,  HEMATOLOGY: No anemia, easy bruising or bleeding SKIN: No rash or lesion. MUSCULOSKELETAL: No joint pain or arthritis.   NEUROLOGIC: No tingling, numbness, weakness.  PSYCHIATRY: No anxiety or depression.   DRUG ALLERGIES:  No Known Allergies  VITALS:  Blood pressure 140/66, pulse 82, temperature 98.1 F (36.7 C), temperature source Oral, resp. rate 18, height 6' (1.829 m), weight 80.74 kg (178 lb), SpO2 92 %.  PHYSICAL EXAMINATION:  GENERAL:  70 y.o.-year-old patient lying in the bed with no acute distress.  EYES: Pupils equal, round, reactive to light and accommodation. No scleral icterus. Extraocular muscles intact.  HEENT: Head atraumatic, normocephalic. Oropharynx and nasopharynx clear.  NECK:  Supple, no jugular venous distention. No thyroid enlargement, no tenderness.  LUNGS: Normal breath sounds bilaterally, no wheezing, rales,rhonchi or crepitation. No use of accessory muscles of respiration.  CARDIOVASCULAR: S1, S2 normal. No murmurs, rubs, or gallops.  ABDOMEN: Soft, nontender, nondistended. Bowel sounds present. No organomegaly or mass.  EXTREMITIES: No pedal edema, cyanosis, or clubbing.  NEUROLOGIC: Cranial  nerves II through XII are intact. Muscle strength 5/5 in all extremities. Sensation intact. Gait not checked.  PSYCHIATRIC: The patient is alert and oriented x 3.  SKIN: No obvious rash, lesion, or ulcer.    LABORATORY PANEL:   CBC  Recent Labs Lab 10/25/15 0529  WBC 7.7  HGB 10.8*  HCT 32.3*  PLT 135*   ------------------------------------------------------------------------------------------------------------------  Chemistries   Recent Labs Lab 10/25/15 0529  NA 138  K 3.6  CL 105  CO2 24  GLUCOSE 202*  BUN 27*  CREATININE 1.73*  CALCIUM 8.0*  MG 2.5*   ------------------------------------------------------------------------------------------------------------------  Cardiac Enzymes  Recent Labs Lab 10/24/15 1840  TROPONINI <0.03   ------------------------------------------------------------------------------------------------------------------  RADIOLOGY:  Ct Head Wo Contrast  10/24/2015  CLINICAL DATA:  Altered mental status with stuttering speech. Seizure EXAM: CT HEAD WITHOUT CONTRAST TECHNIQUE: Contiguous axial images were obtained from the base of the skull through the vertex without intravenous contrast. COMPARISON:  04/05/2015 FINDINGS: Skull and Sinuses:Negative for fracture or destructive process. The visualized mastoids, middle ears, and imaged paranasal sinuses are clear. Visualized orbits: Negative. Brain: No evidence of acute infarction, hemorrhage, hydrocephalus, or mass lesion/mass effect. Patchy low-density in the periventricular and subinsular white matter compatible with chronic small vessel disease. Generalized atrophy, moderate for age. These results were called by telephone at the time of interpretation on 10/24/2015 at 6:57 pm to Dr. Phineas Semen , who verbally acknowledged these results. IMPRESSION: 1. No acute finding. 2. Chronic small vessel disease and atrophy that is stable from prior. Electronically Signed   By: Marnee Spring M.D.    On: 10/24/2015 18:58    EKG:   Orders placed or performed during the hospital encounter of 04/05/15  . ED  EKG  . ED EKG  . EKG 12-Lead  . EKG 12-Lead  . EKG    ASSESSMENT AND PLAN:    1. Breakthrough seizure -could be from noncompliance with his medication/withdrawal from alcohol Resumed Keppra CIWA protocol EEG done, results are pending MRI of the brain without contrast is ordered Appreciate neurology recommendations Patient will be benefited with outpatient alcohol rehabilitation Continue magnesium supplements and check magnesium and phosphorus Neurologist discontinued Wellbutrin Seizure precautions    2. Chronic atrial fibrillation Currently rate controlled, continue diltiazem Continue eliquis for anti-correlation  3. Mild renal insufficiency Gentle hydration with IV fluids, avoid nephrotoxins and check BMP in a.m.   4. Diabetes mellitus Continue Lantus 20 units subcutaneous daily at bedtime Sliding scale insulin and diabetic diet   5. Hypertension Continue home medication lisinopril and amlodipine and titrate as needed  DVT prophylaxis on eliquis   All the records are reviewed and case discussed with Care Management/Social Workerr. Management plans discussed with the patient,  and brother at bedside they are in agreement.  CODE STATUS: fc   TOTAL TIME TAKING CARE OF THIS PATIENT: 35  minutes.   POSSIBLE D/C IN 1-2  DAYS, DEPENDING ON CLINICAL CONDITION.   Ramonita Lab M.D on 10/25/2015 at 11:55 AM  Between 7am to 6pm - Pager - [phone] After 6pm go to [email or web address] - password [email or web address] [facility]  [place] [facility]  Office  [phone]  CC: Primary care physician; Konrad Felix, NP

## 2015-10-25 NOTE — ED Notes (Signed)
Patient denies pain and is resting comfortably.  

## 2015-10-25 NOTE — Care Management Note (Signed)
Case Management Note  Patient Details  Name: Victor [name] MRN: [id number] Date of Birth: 20-Apr-1946  Subjective/Objective:                  Met with patient to discuss discharge planning. [facility] from ED. Patient states he lives alone and has a son name [name] in [place]. Patient states he still drives and has not been using any DME to mobilize. He states his wife died after 47-years back in Mar 01, 2023. 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 Neuro following- MRI and EEG pending.   Action/Plan: I have faxed updated MD notes to [place] this AM. RNCM will continue to follow.   Expected Discharge Date:                  Expected Discharge Plan:     In-House Referral:     Discharge planning Services  CM Consult  Post Acute Care Choice:    Choice offered to:  Patient  DME Arranged:    DME Agency:     HH Arranged:    [place] Agency:     Status of Service:  In process, will continue to follow  Medicare Important Message Given:    Date Medicare IM Given:    Medicare IM give by:    Date Additional Medicare IM Given:    Additional Medicare Important Message give by:     If discussed at [place] of Stay Meetings, dates discussed:    Additional Comments:  Marshell Garfinkel, RN 10/25/2015, 9:52 AM

## 2015-10-25 NOTE — Consult Note (Signed)
Reason for Consult:Seizure Referring Physician: Gouru  CC: Seizure  HPI: Victor [name] is an 70 y.o. male who reports waking up not feeling well yesterday.  His memory is not quite clear form that point on and history is obtained from the chart.  It appears that he was on the phone with his child when they noted the patient stuttering and seemingly having a seizure.  EMS was called.  On their arrival the patient was confused.  Began to stutter again and was noted to have 2 tonic-clonic seizures.  Versed was administered and patient was brought to the ED for further evaluation.  Keppra was loaded.   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  Since then has had spells when he will wake up confused and remain confused for a few hours before becoming normal.  He has found himself on the floor at times as well.  Does not report tongue biting or bowel/bladder incontinence.    Past Medical History  Diagnosis Date  . Atrial fibrillation (HCC)   . Diabetes mellitus without complication (HCC)   . Hypertension   . Hyperlipemia   . Chronic systolic CHF (congestive heart failure) [facility])     Past Surgical History  Procedure Laterality Date  . Tonsillectomy      Family History  Problem Relation Age of Onset  . Heart failure Mother     deceased    Social History:  reports that he has quit smoking. He does not have any smokeless tobacco history on file. He reports that he drinks alcohol daily. He reports slowly cutting back but not stopping completely.  Reports no illicit drug use.  No Known Allergies  Medications:  I have reviewed the patient's current medications. Prior to Admission:  Prescriptions prior to admission  Medication Sig Dispense Refill Last Dose  . allopurinol (ZYLOPRIM) 100 MG tablet Take 200 mg by mouth daily.   unknown at unknown  . amLODipine (NORVASC) 10 MG tablet Take 10 mg by mouth daily.   unknown at unknown  . ammonium lactate  (AMLACTIN) 12 % cream Apply 1 g topically as needed for dry skin. Reported on 10/24/2015   prn at prn  . apixaban (ELIQUIS) 5 MG TABS tablet Take 5 mg by mouth 2 (two) times daily.   unknown at unknown  . buPROPion (WELLBUTRIN SR) 150 MG 12 hr tablet Take 150 mg by mouth 2 (two) times daily.   unknown at unknown  . carvedilol (COREG) 25 MG tablet Take 25 mg by mouth every 12 (twelve) hours.   unknown at unknown  . diltiazem (CARDIZEM) 30 MG tablet Take 1 tablet (30 mg total) by mouth 3 (three) times daily as needed (For HR > 120). 60 tablet 1 prn at prn  . glipiZIDE (GLUCOTROL) 10 MG tablet Take 20 mg by mouth 2 (two) times daily.   unknown at unknown  . insulin glargine (LANTUS) 100 UNIT/ML injection Inject 20 Units into the skin every evening.    unknown at unknown  . levETIRAcetam (KEPPRA) 500 MG tablet Take 500 mg by mouth 2 (two) times daily.   unknown at unknown  . lisinopril (PRINIVIL,ZESTRIL) 20 MG tablet Take 10 mg by mouth daily.   unknown at unknown  . magnesium oxide (MAG-OX) 400 MG tablet Take 400 mg by mouth daily.   unknown at unknown  . simvastatin (ZOCOR) 20 MG tablet Take 10 mg by mouth at bedtime.   unknown at unknown  Scheduled: . allopurinol  200 mg Oral Daily  . amLODipine  10 mg Oral Daily  . apixaban  5 mg Oral BID  . carvedilol  25 mg Oral BID WC  . glipiZIDE  20 mg Oral BID WC  . insulin glargine  20 Units Subcutaneous QPM  . levETIRAcetam  500 mg Oral BID  . lisinopril  10 mg Oral Daily  . magnesium oxide  400 mg Oral Daily  . simvastatin  10 mg Oral QHS  . sodium chloride flush  3 mL Intravenous Q12H  . sodium chloride flush  3 mL Intravenous Q12H    ROS: History obtained from the patient  General ROS: negative for - chills, fatigue, fever, night sweats, weight gain or weight loss Psychological ROS: negative for - behavioral disorder, hallucinations, memory difficulties, mood swings or suicidal ideation Ophthalmic ROS: negative for - blurry vision, double  vision, eye pain or loss of vision ENT ROS: negative for - epistaxis, nasal discharge, oral lesions, sore throat, tinnitus or vertigo Allergy and Immunology ROS: negative for - hives or itchy/watery eyes Hematological and Lymphatic ROS: negative for - bleeding problems, bruising or swollen lymph nodes Endocrine ROS: negative for - galactorrhea, hair pattern changes, polydipsia/polyuria or temperature intolerance Respiratory ROS: negative for - cough, hemoptysis, shortness of breath or wheezing Cardiovascular ROS: negative for - chest pain, dyspnea on exertion, edema or irregular heartbeat Gastrointestinal ROS: negative for - abdominal pain, diarrhea, hematemesis, nausea/vomiting or stool incontinence Genito-Urinary ROS: negative for - dysuria, hematuria, incontinence or urinary frequency/urgency Musculoskeletal ROS: negative for - joint swelling or muscular weakness Neurological ROS: as noted in HPI Dermatological ROS: negative for rash and skin lesion changes  Physical Examination: Blood pressure 134/66, pulse 84, temperature 98.8 F (37.1 C), temperature source Oral, resp. rate 18, height 6' (1.829 m), weight 80.74 kg (178 lb), SpO2 90 %.  HEENT-  Normocephalic, no lesions, without obvious abnormality.  Normal external eye and conjunctiva.  Normal TM's bilaterally.  Normal auditory canals and external ears. Normal external nose, mucus membranes and septum.  Normal pharynx. Cardiovascular- S1, S2 normal, pulses palpable throughout   Lungs- chest clear, no wheezing, rales, normal symmetric air entry Abdomen- soft, non-tender; bowel sounds normal; no masses,  no organomegaly Extremities- no edema Lymph-no adenopathy palpable Musculoskeletal-no joint tenderness, deformity or swelling Skin-warm and dry, no hyperpigmentation, vitiligo, or suspicious lesions  Neurological Examination Mental Status: Alert, oriented.  Responses slow.  Patient with poor memory concerning a lot of events.  Speech  fluent without evidence of aphasia.  Able to follow 3 step commands without difficulty. Cranial Nerves: II: Discs flat bilaterally; Visual fields grossly normal, pupils equal, round, reactive to light and accommodation III,IV, VI: ptosis not present, extra-ocular motions intact bilaterally V,VII: smile symmetric, facial light touch sensation normal bilaterally VIII: hearing normal bilaterally IX,X: gag reflex present XI: bilateral shoulder shrug XII: midline tongue extension Motor: Right : Upper extremity   5/5    Left:     Upper extremity   5/5  Lower extremity   5/5     Lower extremity   5/5 Tone and bulk:normal tone throughout; no atrophy noted Sensory: Pinprick and light touch intact throughout, bilaterally Deep Tendon Reflexes: 2+ in the upper extremities and absent in the lower extremities Plantars: Right: mute   Left: mute Cerebellar: Normal finger-to-nose and normal heel-to-shin testing bilaterally Gait: not tested due to safety concerns   Laboratory Studies:   Basic Metabolic Panel:  Recent Labs Lab 10/24/15 1840 10/25/15 0529  NA 139 138  K 4.7 3.6  CL 105 105  CO2 13* 24  GLUCOSE 252* 202*  BUN 28* 27*  CREATININE 2.04* 1.73*  CALCIUM 8.6* 8.0*    Liver Function Tests: No results for input(s): AST, ALT, ALKPHOS, BILITOT, PROT, ALBUMIN in the last 168 hours. No results for input(s): LIPASE, AMYLASE in the last 168 hours. No results for input(s): AMMONIA in the last 168 hours.  CBC:  Recent Labs Lab 10/24/15 1840 10/25/15 0529  WBC 12.4* 7.7  NEUTROABS 11.7*  --   HGB 12.4* 10.8*  HCT 37.5* 32.3*  MCV 94.6 95.7  PLT 169 135*    Cardiac Enzymes:  Recent Labs Lab 10/24/15 1840  TROPONINI <0.03    BNP: Invalid input(s): POCBNP  CBG: No results for input(s): GLUCAP in the last 168 hours.  Microbiology: No results found for this or any previous visit.  Coagulation Studies: No results for input(s): LABPROT, INR in the last 72  hours.  Urinalysis:  Recent Labs Lab 10/25/15 0422  COLORURINE YELLOW*  LABSPEC 1.013  PHURINE 6.0  GLUCOSEU NEGATIVE  HGBUR NEGATIVE  BILIRUBINUR NEGATIVE  KETONESUR TRACE*  PROTEINUR NEGATIVE  NITRITE NEGATIVE  LEUKOCYTESUR NEGATIVE    Lipid Panel:  No results found for: CHOL, TRIG, HDL, CHOLHDL, VLDL, LDLCALC  HgbA1C: No results found for: HGBA1C  Urine Drug Screen:  No results found for: LABOPIA, COCAINSCRNUR, LABBENZ, AMPHETMU, THCU, LABBARB  Alcohol Level: No results for input(s): ETH in the last 168 hours.   Imaging: Ct Head Wo Contrast  10/24/2015  CLINICAL DATA:  Altered mental status with stuttering speech. Seizure EXAM: CT HEAD WITHOUT CONTRAST TECHNIQUE: Contiguous axial images were obtained from the base of the skull through the vertex without intravenous contrast. COMPARISON:  04/05/2015 FINDINGS: Skull and Sinuses:Negative for fracture or destructive process. The visualized mastoids, middle ears, and imaged paranasal sinuses are clear. Visualized orbits: Negative. Brain: No evidence of acute infarction, hemorrhage, hydrocephalus, or mass lesion/mass effect. Patchy low-density in the periventricular and subinsular white matter compatible with chronic small vessel disease. Generalized atrophy, moderate for age. These results were called by telephone at the time of interpretation on 10/24/2015 at 6:57 pm to Dr. Phineas Semen , who verbally acknowledged these results. IMPRESSION: 1. No acute finding. 2. Chronic small vessel disease and atrophy that is stable from prior. Electronically Signed   By: Marnee Spring M.D.   On: 10/24/2015 18:58     Assessment/Plan: 70 year old male presenting with new onset seizures.  Possibility of seizure activity in the past.  Is a daily drinker.  On Wellbutrin.  Has had two strokes in the past.  On Eliquis for atrial fibrillation.  Head CT personally reviewed and shows no acute changes.  Can not rule out recurrent infarct or side effect  of Wellbutrin.  Late effects of stroke in the differential as well.  Patient has not stopped drinking for this to be a ETOH withdrawal seizure.    Recommendations: 1.  CIWA protocol 2.  EEG 3.  MRI of the brain without contrast due to renal insufficiency.   4.  Seizure percautions 5.  Serum magnesium and phosphorus 6.  D/C Wellbutrin 7.  Continue Keppra at 500mg  BID  Thana Farr, MD Neurology [phone] 10/25/2015, 9:47 AM

## 2015-10-25 NOTE — Progress Notes (Signed)
Orderly here to take patient to MR- of brain via bed. Monitor tech notified.

## 2015-10-25 NOTE — Progress Notes (Signed)
No seizures noted this shift, pt continues to have blunt affect. Forgetful, unable to recall events prior to today through Wednesday.

## 2015-10-25 NOTE — Procedures (Signed)
ELECTROENCEPHALOGRAM REPORT   Patient: Victor [name]       Room #: 136A-AA EEG No. ID: [id number] Age: 70 y.o.        Sex: male Referring Physician: Gouru Report Date:  10/25/2015        Interpreting Physician: Thana Farr  History: Victor [name] is an 70 y.o. male with new onset seizures  Medications:  Scheduled: . allopurinol  200 mg Oral Daily  . amLODipine  10 mg Oral Daily  . apixaban  5 mg Oral BID  . carvedilol  25 mg Oral BID WC  . glipiZIDE  20 mg Oral BID WC  . insulin aspart  0-5 Units Subcutaneous QHS  . insulin aspart  0-9 Units Subcutaneous TID WC  . insulin glargine  20 Units Subcutaneous QPM  . levETIRAcetam  500 mg Oral BID  . lisinopril  10 mg Oral Daily  . magnesium oxide  400 mg Oral Daily  . simvastatin  10 mg Oral QHS  . sodium chloride flush  3 mL Intravenous Q12H  . sodium chloride flush  3 mL Intravenous Q12H    Conditions of Recording:  This is a 16 channel EEG carried out with the patient in the awake, drowsy and asleep states.  Description:  The waking background activity consists of a low voltage, symmetrical, fairly well organized, 9 Hz alpha activity, seen from the parieto-occipital and posterior temporal regions.  Low voltage fast activity, poorly organized, is seen anteriorly and is at times superimposed on more posterior regions.  A mixture of theta and alpha rhythms are seen from the central and temporal regions. The patient drowses with slowing to irregular, low voltage theta and beta activity.   The patient goes in to a light sleep with symmetrical sleep spindles, vertex central sharp transients and irregular slow activity.  No epileptiform activity is noted.   Hyperventilation and intermittent photic stimulation were not performed.   IMPRESSION: This is a normal electroencephalogram.  No epileptiform activity is noted.     Thana Farr, MD Neurology [phone] 10/25/2015, 1:01 PM

## 2015-10-25 NOTE — ED Provider Notes (Signed)
-----------------------------------------   23:43 PM on 10/24/2015 -----------------------------------------  Care assumed from Dr. Langston Masker. In summary, this is a 70 year old male who presents with multiple seizures today. Received IV Keppra by primary provider. Laboratory results and head CT reviewed. Will add troponin given patient's age. Patient is resting in no acute distress. Plan is to transfer patient to the [place] where he receives the majority of his care. Paperwork has been faxed and we are awaiting a call back.  ----------------------------------------- 1:14 AM on 10/25/2015 -----------------------------------------  I am told there are no beds at the [facility]. Discussed case with hospitalist to evaluate patient in the emergency department for admission.  Irean Hong, MD 10/25/15 [phone]

## 2015-10-25 NOTE — [place] FL2 (Signed)
[place] MEDICAID FL2 LEVEL OF CARE SCREENING TOOL     IDENTIFICATION  Patient Name: Victor [name] Birthdate: Feb 05, 1946 Sex: male Admission Date (Current Location): 10/24/2015  [place] and [place] Number:  Chiropodist and Address:  [facility], [address] [place], [place] [address]      Provider Number: [id number]  Attending Physician Name and Address:  Ramonita Lab, MD  Relative Name and Phone Number:       Current Level of Care: Hospital Recommended Level of Care: Skilled Nursing Facility Prior Approval Number:    Date Approved/Denied:   PASRR Number:  ( [id number] )  Discharge Plan: SNF    Current Diagnoses: Patient Active Problem List   Diagnosis Date Noted  . Seizure (HCC) 10/25/2015  . A-fib (HCC) 04/05/2015    Orientation RESPIRATION BLADDER Height & Weight     Self, Time, Place  Normal Incontinent Weight: 178 lb (80.74 kg) Height:  6' (182.9 cm)  BEHAVIORAL SYMPTOMS/MOOD NEUROLOGICAL BOWEL NUTRITION STATUS   (none ) Convulsions/Seizures Continent Diet (Diet: Heart Healthy/ Carb Modified )  AMBULATORY STATUS COMMUNICATION OF NEEDS Skin   Extensive Assist Verbally Normal                       Personal Care Assistance Level of Assistance  Bathing, Feeding, Dressing Bathing Assistance: Limited assistance Feeding assistance: Independent Dressing Assistance: Limited assistance     Functional Limitations Info  Sight, Hearing, Speech Sight Info: Adequate Hearing Info: Adequate Speech Info: Adequate    SPECIAL CARE FACTORS FREQUENCY  PT (By licensed PT), OT (By licensed OT)     PT Frequency:  (5) OT Frequency:  (5)            Contractures      Additional Factors Info  Code Status, Insulin Sliding Scale Code Status Info:  (Full Code. )     Insulin Sliding Scale Info:  (NovoLog Insulin Injections 3 times daily )       Current Medications (10/25/2015):  This is the current hospital active  medication list Current Facility-Administered Medications  Medication Dose Route Frequency Provider Last Rate Last Dose  . 0.9 %  sodium chloride infusion  250 mL Intravenous PRN Pavan Pyreddy, MD      . 0.9 %  sodium chloride infusion   Intravenous Continuous Ihor Austin, MD 75 mL/hr at 10/25/15 0500    . acetaminophen (TYLENOL) tablet 650 mg  650 mg Oral Q6H PRN Ihor Austin, MD   650 mg at 10/25/15 0500   Or  . acetaminophen (TYLENOL) suppository 650 mg  650 mg Rectal Q6H PRN Ihor Austin, MD      . allopurinol (ZYLOPRIM) tablet 200 mg  200 mg Oral Daily Pavan Pyreddy, MD   200 mg at 10/25/15 1006  . amLODipine (NORVASC) tablet 10 mg  10 mg Oral Daily Ihor Austin, MD   10 mg at 10/25/15 1007  . ammonium lactate (AMLACTIN) 12 % cream 1 g  1 g Topical PRN Ihor Austin, MD      . apixaban (ELIQUIS) tablet 5 mg  5 mg Oral BID Ihor Austin, MD   5 mg at 10/25/15 1007  . carvedilol (COREG) tablet 25 mg  25 mg Oral BID WC Ihor Austin, MD   25 mg at 10/25/15 1007  . diltiazem (CARDIZEM) tablet 30 mg  30 mg Oral TID PRN Ihor Austin, MD      . insulin aspart (novoLOG) injection 0-5 Units  0-5 Units Subcutaneous QHS Amirrah Quigley, MD      . insulin aspart (novoLOG) injection 0-9 Units  0-9 Units Subcutaneous TID WC Dontavian Marchi, MD      . insulin glargine (LANTUS) injection 20 Units  20 Units Subcutaneous QPM Pavan Pyreddy, MD      . levETIRAcetam (KEPPRA) tablet 500 mg  500 mg Oral BID Ihor Austin, MD   500 mg at 10/25/15 1010  . lisinopril (PRINIVIL,ZESTRIL) tablet 10 mg  10 mg Oral Daily Ihor Austin, MD   10 mg at 10/25/15 1007  . magnesium oxide (MAG-OX) tablet 400 mg  400 mg Oral Daily Ihor Austin, MD   400 mg at 10/25/15 1007  . ondansetron (ZOFRAN) tablet 4 mg  4 mg Oral Q6H PRN Ihor Austin, MD       Or  . ondansetron (ZOFRAN) injection 4 mg  4 mg Intravenous Q6H PRN Ihor Austin, MD      . simvastatin (ZOCOR) tablet 10 mg  10 mg Oral QHS Pavan Pyreddy, MD      . sodium  chloride flush (NS) 0.9 % injection 3 mL  3 mL Intravenous Q12H Pavan Pyreddy, MD   0 mL at 10/25/15 1000  . sodium chloride flush (NS) 0.9 % injection 3 mL  3 mL Intravenous Q12H Ihor Austin, MD   3 mL at 10/25/15 1127  . sodium chloride flush (NS) 0.9 % injection 3 mL  3 mL Intravenous PRN Ihor Austin, MD         Discharge Medications: Please see discharge summary for a list of discharge medications.  Relevant Imaging Results:  Relevant Lab Results:   Additional Information  (SSN: [id number])  Haig Prophet, LCSW

## 2015-10-25 NOTE — Progress Notes (Signed)
Inpatient Diabetes Program Recommendations  AACE/ADA: New Consensus Statement on Inpatient Glycemic Control (2015)  Target Ranges:  Prepandial:   less than 140 mg/dL      Peak postprandial:   less than 180 mg/dL (1-2 hours)      Critically ill patients:  140 - 180 mg/dL  Results for KOHLE, WINNER (MRN [id number]) as of 10/25/2015 09:18  Ref. Range 10/24/2015 18:40 10/25/2015 05:29  Glucose Latest Ref Range: 65-99 mg/dL 784 (H) 696 (H)   Review of Glycemic Control  Diabetes history: DM2 Outpatient Diabetes medications: Glipizide 20 mg BID, Lantus 20 units QPM Current orders for Inpatient glycemic control: Lantus 20 units QAM, Glipizide 20 mg BID  Inpatient Diabetes Program Recommendations: Correction (SSI): While inpatient, please order CBGs and Novolog correction scale ACHS. HgbA1C: Please consider ordering an A1C to evaluate glycemic control over the past 2-3 months.  Thanks, Orlando Penner, RN, MSN, CDE Diabetes Coordinator Inpatient Diabetes Program [phone] (Team Pager from 8am to 5pm) [phone] ([facility] office) [phone] [facility] office) [phone] [facility] office)

## 2015-10-25 NOTE — Care Management Obs Status (Signed)
MEDICARE OBSERVATION STATUS NOTIFICATION   Patient Details  Name: Victor [name] MRN: [id number] Date of Birth: May 30, 1946   Medicare Observation Status Notification Given:  Yes    Collie Siad, RN 10/25/2015, 12:07 PM

## 2015-10-25 NOTE — Progress Notes (Signed)
EEG completed, results pending. 

## 2015-10-26 DIAGNOSIS — R569 Unspecified convulsions: Secondary | ICD-10-CM | POA: Diagnosis not present

## 2015-10-26 LAB — GLUCOSE, CAPILLARY
GLUCOSE-CAPILLARY: 68 mg/dL (ref 65–99)
GLUCOSE-CAPILLARY: 261 mg/dL — AB (ref 65–99)
Glucose-Capillary: 150 mg/dL — ABNORMAL HIGH (ref 65–99)

## 2015-10-26 LAB — BASIC METABOLIC PANEL
ANION GAP: 5 (ref 5–15)
BUN: 22 mg/dL — ABNORMAL HIGH (ref 6–20)
CALCIUM: 8.2 mg/dL — AB (ref 8.9–10.3)
CHLORIDE: 109 mmol/L (ref 101–111)
CO2: 27 mmol/L (ref 22–32)
CREATININE: 1.5 mg/dL — AB (ref 0.61–1.24)
GFR calc non Af Amer: 46 mL/min — ABNORMAL LOW (ref >60)
GFR, EST AFRICAN AMERICAN: 53 mL/min — AB (ref >60)
Glucose, Bld: 56 mg/dL — ABNORMAL LOW (ref 65–99)
Potassium: 3.3 mmol/L — ABNORMAL LOW (ref 3.5–5.1)
SODIUM: 141 mmol/L (ref 135–145)

## 2015-10-26 MED ORDER — LEVETIRACETAM 750 MG PO TABS: 750.0000 mg | ORAL_TABLET | Freq: Two times a day (BID) | ORAL | Status: AC

## 2015-10-26 MED ORDER — LEVETIRACETAM 750 MG PO TABS
750.0000 mg | ORAL_TABLET | Freq: Two times a day (BID) | ORAL | Status: DC
  Filled 2015-10-26: qty 1

## 2015-10-26 MED ORDER — POTASSIUM CHLORIDE CRYS ER 20 MEQ PO TBCR
20.0000 meq | EXTENDED_RELEASE_TABLET | Freq: Two times a day (BID) | ORAL | Status: DC
  Administered 2015-10-26: 20 meq via ORAL
  Filled 2015-10-26: qty 1

## 2015-10-26 NOTE — Discharge Instructions (Signed)
Follow with neurologist in 2 weeks.

## 2015-10-26 NOTE — Progress Notes (Signed)
Patient is ready for discharge at 5:00 PM, does not have a ride home.  Patient's son lives in [place], came to transport him home earlier in the day and waited several hours but had to leave as MD has not confirmed patient's discharge.  Patient has no other support system to transport him home.  RN verified that patient is safe to ride in [place] alone.  CSW provided taxi voucher as no public transportation on the weekends.  Sammuel Hines. LCSWA Clinical Social Work Department [phone] 5:20 PM

## 2015-10-26 NOTE — Progress Notes (Signed)
Pt discharged home. Instructions given by charge nurse. Pt did refused his coreg and insulin (S) as he has them at home and takes them at a later time. I did give him his potassium replacement. He verbalized to me he understood the increase in his seizure med.

## 2015-10-26 NOTE — Progress Notes (Signed)
Pt states he feels more like himself today. Affect in my opinion is normal.

## 2015-10-26 NOTE — Discharge Summary (Signed)
[facility] [place] at [facility]   PATIENT NAME: Victor [name]    MR#:  [id number]  DATE OF BIRTH:  10-Sep-1945  DATE OF ADMISSION:  10/24/2015 ADMITTING PHYSICIAN: Ihor Austin, MD  DATE OF DISCHARGE: 10/26/2015  PRIMARY CARE PHYSICIAN: Konrad Felix, NP    ADMISSION DIAGNOSIS:  New onset seizure (HCC) [R56.9] Secondary hypertension, unspecified [I15.9] Type 2 diabetes mellitus without complication, without long-term current use of insulin (HCC) [E11.9] Atrial fibrillation, unspecified type (HCC) [I48.91]  DISCHARGE DIAGNOSIS:  Active Problems:   Seizure (HCC)   SECONDARY DIAGNOSIS:   Past Medical History  Diagnosis Date  . Atrial fibrillation (HCC)   . Diabetes mellitus without complication (HCC)   . Hypertension   . Hyperlipemia   . Chronic systolic CHF (congestive heart failure) (HCC)     HOSPITAL COURSE:   1. Breakthrough seizure -could be from noncompliance with his medication/withdrawal from alcohol Resumed Keppra CIWA protocol EEG done, results are pending MRI of the brain without contrast is ordered Appreciate neurology recommendations Patient will be benefited with outpatient alcohol rehabilitation Continue magnesium supplements and check magnesium and phosphorus Neurologist discontinued Wellbutrin Seizure precautions Neurologist suggested increase keppra to 750 mg BID>   2. Chronic atrial fibrillation Currently rate controlled, continue diltiazem Continue eliquis for anti-correlation  3. Mild renal insufficiency Gentle hydration with IV fluids, avoid nephrotoxins and check BMP in a.m.   4. Diabetes mellitus Continue Lantus 20 units subcutaneous daily at bedtime Sliding scale insulin and diabetic diet   5. Hypertension Continue home medication lisinopril and amlodipine and titrate as needed  DISCHARGE CONDITIONS:   Stable.  CONSULTS OBTAINED:  Treatment Team:  Kym Groom, MD  DRUG ALLERGIES:  No  Known Allergies  DISCHARGE MEDICATIONS:   Current Discharge Medication List    CONTINUE these medications which have CHANGED   Details  levETIRAcetam (KEPPRA) 750 MG tablet Take 1 tablet (750 mg total) by mouth 2 (two) times daily. Qty: 60 tablet, Refills: 0      CONTINUE these medications which have NOT CHANGED   Details  allopurinol (ZYLOPRIM) 100 MG tablet Take 200 mg by mouth daily.    amLODipine (NORVASC) 10 MG tablet Take 10 mg by mouth daily.    ammonium lactate (AMLACTIN) 12 % cream Apply 1 g topically as needed for dry skin. Reported on 10/24/2015    apixaban (ELIQUIS) 5 MG TABS tablet Take 5 mg by mouth 2 (two) times daily.    carvedilol (COREG) 25 MG tablet Take 25 mg by mouth every 12 (twelve) hours.    diltiazem (CARDIZEM) 30 MG tablet Take 1 tablet (30 mg total) by mouth 3 (three) times daily as needed (For HR > 120). Qty: 60 tablet, Refills: 1    glipiZIDE (GLUCOTROL) 10 MG tablet Take 20 mg by mouth 2 (two) times daily.    insulin glargine (LANTUS) 100 UNIT/ML injection Inject 20 Units into the skin every evening.     lisinopril (PRINIVIL,ZESTRIL) 20 MG tablet Take 10 mg by mouth daily.    magnesium oxide (MAG-OX) 400 MG tablet Take 400 mg by mouth daily.    simvastatin (ZOCOR) 20 MG tablet Take 10 mg by mouth at bedtime.      STOP taking these medications     buPROPion (WELLBUTRIN SR) 150 MG 12 hr tablet          DISCHARGE INSTRUCTIONS:    Follow with neurologist in 2 weeks.  If you experience worsening of your admission symptoms, develop shortness  of breath, life threatening emergency, suicidal or homicidal thoughts you must seek medical attention immediately by calling 911 or calling your MD immediately  if symptoms less severe.  You Must read complete instructions/literature along with all the possible adverse reactions/side effects for all the Medicines you take and that have been prescribed to you. Take any new Medicines after you have  completely understood and accept all the possible adverse reactions/side effects.   Please note  You were cared for by a hospitalist during your hospital stay. If you have any questions about your discharge medications or the care you received while you were in the hospital after you are discharged, you can call the unit and asked to speak with the hospitalist on call if the hospitalist that took care of you is not available. Once you are discharged, your primary care physician will handle any further medical issues. Please note that NO REFILLS for any discharge medications will be authorized once you are discharged, as it is imperative that you return to your primary care physician (or establish a relationship with a primary care physician if you do not have one) for your aftercare needs so that they can reassess your need for medications and monitor your lab values.    Today   CHIEF COMPLAINT:   Chief Complaint  Patient presents with  . Altered Mental Status  . Seizures    HISTORY OF PRESENT ILLNESS:  Victor [name]  is a 70 y.o. male with a known history of atrial fibrillation, diabetes mellitus, hypertension, hyperlipidemia, systolic heart failure presented to the emergency room because of confusion. Patient wasn't postictal state when he was brought to the emergency room. He lives alone and does not remember the sequence of events. ER physician noted the patient to be in postictal state and patient had a seizure. [facility]. No complaints of chest pain. No shortness of breath. No headache dizziness or blurry vision. He is more awake and an alert after he came to the emergency room. Patient takes Keppra orally but does not know whether he has seizure disorder. No documentation from his [facility]. Patient was given IV Keppra in the emergency room. No new episodes of seizures after arrival   VITAL SIGNS:  Blood pressure 132/84, pulse 87,  temperature 98.7 F (37.1 C), temperature source Oral, resp. rate 18, height 6' (1.829 m), weight 80.74 kg (178 lb), SpO2 90 %.  I/O:   Intake/Output Summary (Last 24 hours) at 10/26/15 1607 Last data filed at 10/26/15 1200  Gross per 24 hour  Intake   1288 ml  Output   1025 ml  Net    263 ml    PHYSICAL EXAMINATION:   GENERAL: 70 y.o.-year-old patient lying in the bed with no acute distress.  EYES: Pupils equal, round, reactive to light and accommodation. No scleral icterus. Extraocular muscles intact.  HEENT: Head atraumatic, normocephalic. Oropharynx and nasopharynx clear.  NECK: Supple, no jugular venous distention. No thyroid enlargement, no tenderness.  LUNGS: Normal breath sounds bilaterally, no wheezing, rales,rhonchi or crepitation. No use of accessory muscles of respiration.  CARDIOVASCULAR: S1, S2 normal. No murmurs, rubs, or gallops.  ABDOMEN: Soft, nontender, nondistended. Bowel sounds present. No organomegaly or mass.  EXTREMITIES: No pedal edema, cyanosis, or clubbing.  NEUROLOGIC: Cranial nerves II through XII are intact. Muscle strength 5/5 in all extremities. Sensation intact. Gait not checked.  PSYCHIATRIC: The patient is alert and oriented x 3.  SKIN: No  obvious rash, lesion, or ulcer.   DATA REVIEW:   CBC  Recent Labs Lab 10/25/15 0529  WBC 7.7  HGB 10.8*  HCT 32.3*  PLT 135*    Chemistries   Recent Labs Lab 10/25/15 0529 10/26/15 0308  NA 138 141  K 3.6 3.3*  CL 105 109  CO2 24 27  GLUCOSE 202* 56*  BUN 27* 22*  CREATININE 1.73* 1.50*  CALCIUM 8.0* 8.2*  MG 2.5*  --     Cardiac Enzymes  Recent Labs Lab 10/24/15 1840  TROPONINI <0.03    Microbiology Results  No results found for this or any previous visit.  RADIOLOGY:  Ct Head Wo Contrast  10/24/2015  CLINICAL DATA:  Altered mental status with stuttering speech. Seizure EXAM: CT HEAD WITHOUT CONTRAST TECHNIQUE: Contiguous axial images were obtained from the base of  the skull through the vertex without intravenous contrast. COMPARISON:  04/05/2015 FINDINGS: Skull and Sinuses:Negative for fracture or destructive process. The visualized mastoids, middle ears, and imaged paranasal sinuses are clear. Visualized orbits: Negative. Brain: No evidence of acute infarction, hemorrhage, hydrocephalus, or mass lesion/mass effect. Patchy low-density in the periventricular and subinsular white matter compatible with chronic small vessel disease. Generalized atrophy, moderate for age. These results were called by telephone at the time of interpretation on 10/24/2015 at 6:57 pm to Dr. Phineas Semen , who verbally acknowledged these results. IMPRESSION: 1. No acute finding. 2. Chronic small vessel disease and atrophy that is stable from prior. Electronically Signed   By: Marnee Spring M.D.   On: 10/24/2015 18:58   Mr Brain Wo Contrast  10/25/2015  CLINICAL DATA:  Seizure EXAM: MRI HEAD WITHOUT CONTRAST TECHNIQUE: Multiplanar, multiecho pulse sequences of the brain and surrounding structures were obtained without intravenous contrast. COMPARISON:  CT 10/24/2015 FINDINGS: Generalized atrophy.  Negative for hydrocephalus. Negative for acute infarct. Mild chronic microvascular ischemic change in the white matter. Brainstem intact. Temporal lobes show mild atrophy. No focal mass or signal abnormality. Negative for mesial temporal sclerosis. Chronic areas of hemorrhage in the left frontal lobe and left temporoparietal lobe. Question prior trauma or hypertension. 6 mm triangular calcification of the left parietal dura appears extra-axial. This may represent a small meningioma and is unchanged from prior CT scans. No edema in the adjacent brain. IMPRESSION: Generalized atrophy without acute abnormality. Mild chronic microvascular ischemic change Chronic hemorrhage left frontal and parietal lobe. Question prior head trauma or hypertension Probable 6 mm calcified meningioma left parietal lobe.  Electronically Signed   By: Marlan Palau M.D.   On: 10/25/2015 17:44    EKG:   Orders placed or performed during the hospital encounter of 04/05/15  . ED EKG  . ED EKG  . EKG 12-Lead  . EKG 12-Lead  . EKG      Management plans discussed with the patient, family and they are in agreement.  CODE STATUS:     Code Status Orders        Start     Ordered   10/25/15 0438  Full code   Continuous     10/25/15 0437    Code Status History    Date Active Date Inactive Code Status Order ID Comments User Context   04/05/2015  5:30 PM 04/07/2015  2:11 PM Full Code [id number]  Milagros Loll, MD ED    Advance Directive Documentation        Most Recent Value   Type of Advance Directive  Healthcare Power of [place], Living will  Pre-existing out of facility DNR order (yellow form or pink MOST form)     "MOST" Form in Place?        TOTAL TIME TAKING CARE OF THIS PATIENT: 35 minutes.    Altamese Dilling M.D on 10/26/2015 at 4:07 PM  Between 7am to 6pm - Pager - [phone]  After 6pm go to [email or web address] - password [email or web address]  [facility]  Office  [phone]  CC: Primary care physician; Konrad Felix, NP   Note: This dictation was prepared with Dragon dictation along with smaller phrase technology. Any transcriptional errors that result from this process are unintentional.

## 2015-10-26 NOTE — Progress Notes (Signed)
Subjective: No further seizures noted.  Patient not as blunted today.  Is able to tell me that he has a history of seizures today and is on Keppra.  Objective: Current vital signs: BP 143/63 mmHg  Pulse 82  Temp(Src) 98.7 F (37.1 C) (Oral)  Resp 18  Ht 6' (1.829 m)  Wt 80.74 kg (178 lb)  BMI 24.14 kg/m2  SpO2 91% Vital signs in last 24 hours: Temp:  [97.7 F (36.5 C)-98.7 F (37.1 C)] 98.7 F (37.1 C) (02/18 1132) Pulse Rate:  [75-82] 82 (02/18 1132) Resp:  [17-20] 18 (02/18 0732) BP: (132-149)/(63-71) 143/63 mmHg (02/18 1132) SpO2:  [91 %-93 %] 91 % (02/18 1132)  Intake/Output from previous day: 02/17 0701 - 02/18 0700 In: 808 [P.O.:360; I.V.:448] Out: 900 [Urine:900] Intake/Output this shift: Total I/O In: 360 [P.O.:360] Out: 425 [Urine:425] Nutritional status: Diet heart healthy/carb modified Room service appropriate?: Yes; Fluid consistency:: Thin  Neurologic Exam: Mental Status: Alert, oriented. Speech fluent without evidence of aphasia. Able to follow 3 step commands without difficulty. Cranial Nerves: II: Discs flat bilaterally; Visual fields grossly normal, pupils equal, round, reactive to light and accommodation III,IV, VI: ptosis not present, extra-ocular motions intact bilaterally V,VII: smile symmetric, facial light touch sensation normal bilaterally VIII: hearing normal bilaterally IX,X: gag reflex present XI: bilateral shoulder shrug XII: midline tongue extension Motor: Right :Upper extremity 5/5Left: Upper extremity 5/5 Lower extremity 5/5Lower extremity 5/5 Tone and bulk:normal tone throughout; no atrophy noted Sensory: Pinprick and light touch intact throughout, bilaterally   Lab Results: Basic Metabolic Panel:  Recent Labs Lab 10/24/15 1840 10/25/15 0529 10/26/15 0308  NA 139 138 141  K 4.7 3.6 3.3*  CL 105 105 109  CO2  13* 24 27  GLUCOSE 252* 202* 56*  BUN 28* 27* 22*  CREATININE 2.04* 1.73* 1.50*  CALCIUM 8.6* 8.0* 8.2*  MG  --  2.5*  --   PHOS  --  4.0  --     Liver Function Tests: No results for input(s): AST, ALT, ALKPHOS, BILITOT, PROT, ALBUMIN in the last 168 hours. No results for input(s): LIPASE, AMYLASE in the last 168 hours. No results for input(s): AMMONIA in the last 168 hours.  CBC:  Recent Labs Lab 10/24/15 1840 10/25/15 0529  WBC 12.4* 7.7  NEUTROABS 11.7*  --   HGB 12.4* 10.8*  HCT 37.5* 32.3*  MCV 94.6 95.7  PLT 169 135*    Cardiac Enzymes:  Recent Labs Lab 10/24/15 1840  TROPONINI <0.03    Lipid Panel: No results for input(s): CHOL, TRIG, HDL, CHOLHDL, VLDL, LDLCALC in the last 168 hours.  CBG:  Recent Labs Lab 10/25/15 1632 10/25/15 2104 10/26/15 0733 10/26/15 1133  GLUCAP 149* 135* 68 261*    Microbiology: No results found for this or any previous visit.  Coagulation Studies: No results for input(s): LABPROT, INR in the last 72 hours.  Imaging: Ct Head Wo Contrast  10/24/2015  CLINICAL DATA:  Altered mental status with stuttering speech. Seizure EXAM: CT HEAD WITHOUT CONTRAST TECHNIQUE: Contiguous axial images were obtained from the base of the skull through the vertex without intravenous contrast. COMPARISON:  04/05/2015 FINDINGS: Skull and Sinuses:Negative for fracture or destructive process. The visualized mastoids, middle ears, and imaged paranasal sinuses are clear. Visualized orbits: Negative. Brain: No evidence of acute infarction, hemorrhage, hydrocephalus, or mass lesion/mass effect. Patchy low-density in the periventricular and subinsular white matter compatible with chronic small vessel disease. Generalized atrophy, moderate for age. These results were called by  telephone at the time of interpretation on 10/24/2015 at 6:57 pm to Dr. Phineas Semen , who verbally acknowledged these results. IMPRESSION: 1. No acute finding. 2. Chronic small  vessel disease and atrophy that is stable from prior. Electronically Signed   By: Marnee Spring M.D.   On: 10/24/2015 18:58   Mr Brain Wo Contrast  10/25/2015  CLINICAL DATA:  Seizure EXAM: MRI HEAD WITHOUT CONTRAST TECHNIQUE: Multiplanar, multiecho pulse sequences of the brain and surrounding structures were obtained without intravenous contrast. COMPARISON:  CT 10/24/2015 FINDINGS: Generalized atrophy.  Negative for hydrocephalus. Negative for acute infarct. Mild chronic microvascular ischemic change in the white matter. Brainstem intact. Temporal lobes show mild atrophy. No focal mass or signal abnormality. Negative for mesial temporal sclerosis. Chronic areas of hemorrhage in the left frontal lobe and left temporoparietal lobe. Question prior trauma or hypertension. 6 mm triangular calcification of the left parietal dura appears extra-axial. This may represent a small meningioma and is unchanged from prior CT scans. No edema in the adjacent brain. IMPRESSION: Generalized atrophy without acute abnormality. Mild chronic microvascular ischemic change Chronic hemorrhage left frontal and parietal lobe. Question prior head trauma or hypertension Probable 6 mm calcified meningioma left parietal lobe. Electronically Signed   By: Marlan Palau M.D.   On: 10/25/2015 17:44    Medications:  I have reviewed the patient's current medications. Scheduled: . allopurinol  200 mg Oral Daily  . amLODipine  10 mg Oral Daily  . apixaban  5 mg Oral BID  . carvedilol  25 mg Oral BID WC  . insulin aspart  0-5 Units Subcutaneous QHS  . insulin aspart  0-9 Units Subcutaneous TID WC  . insulin glargine  20 Units Subcutaneous QPM  . levETIRAcetam  750 mg Oral BID  . lisinopril  10 mg Oral Daily  . magnesium oxide  400 mg Oral Daily  . potassium chloride  20 mEq Oral BID  . simvastatin  10 mg Oral QHS  . sodium chloride flush  3 mL Intravenous Q12H  . sodium chloride flush  3 mL Intravenous Q12H     Assessment/Plan: No further seizures noted since admission.  Patient on Keppra at home,  BID.  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  This is likely his seizure etiology.    Recommendations: 1.  Increase Keppra to  BID 2.  Patient to remain off Wellbutrin 3.  Patient to follow up with his neurologist as an outpatient.  No further inpatient evaluation indicated at this time.  4.  Patient unable to drive, operate heavy machinery, perform activities at heights and participate in water activities until release by outpatient physician.      Thana Farr, MD Neurology [phone] 10/26/2015  1:29 PM

## 2016-03-06 IMAGING — CR DG CHEST 2V
1 series · 2 of 2 positions shown · non-contrast
Comparison: 11/07/2014

CLINICAL DATA: Syncope.

EXAM:
CHEST  2 VIEW

[Series 1: dg chest 2 view · 0.14mm/px · 2 of 2 slices shown]
[im 1/2]
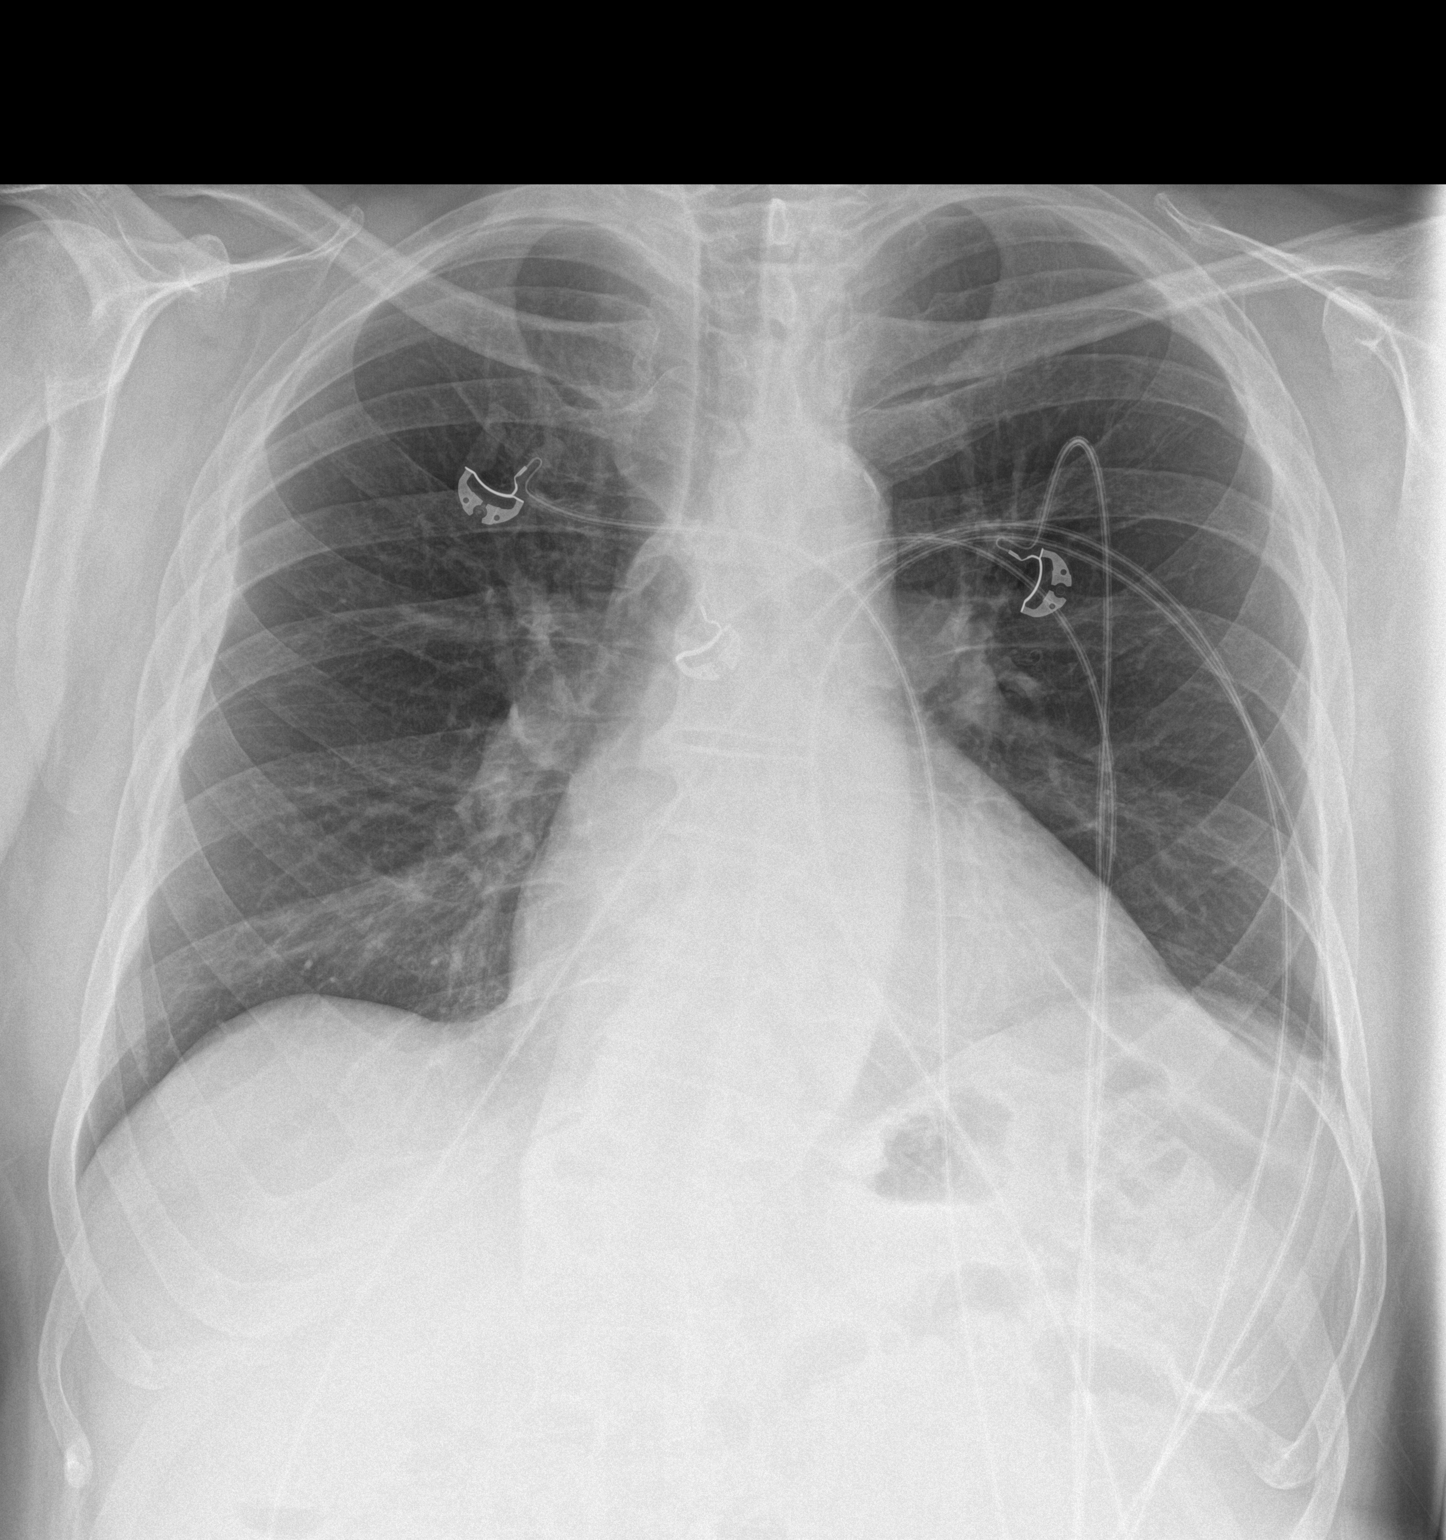
[im 2/2]
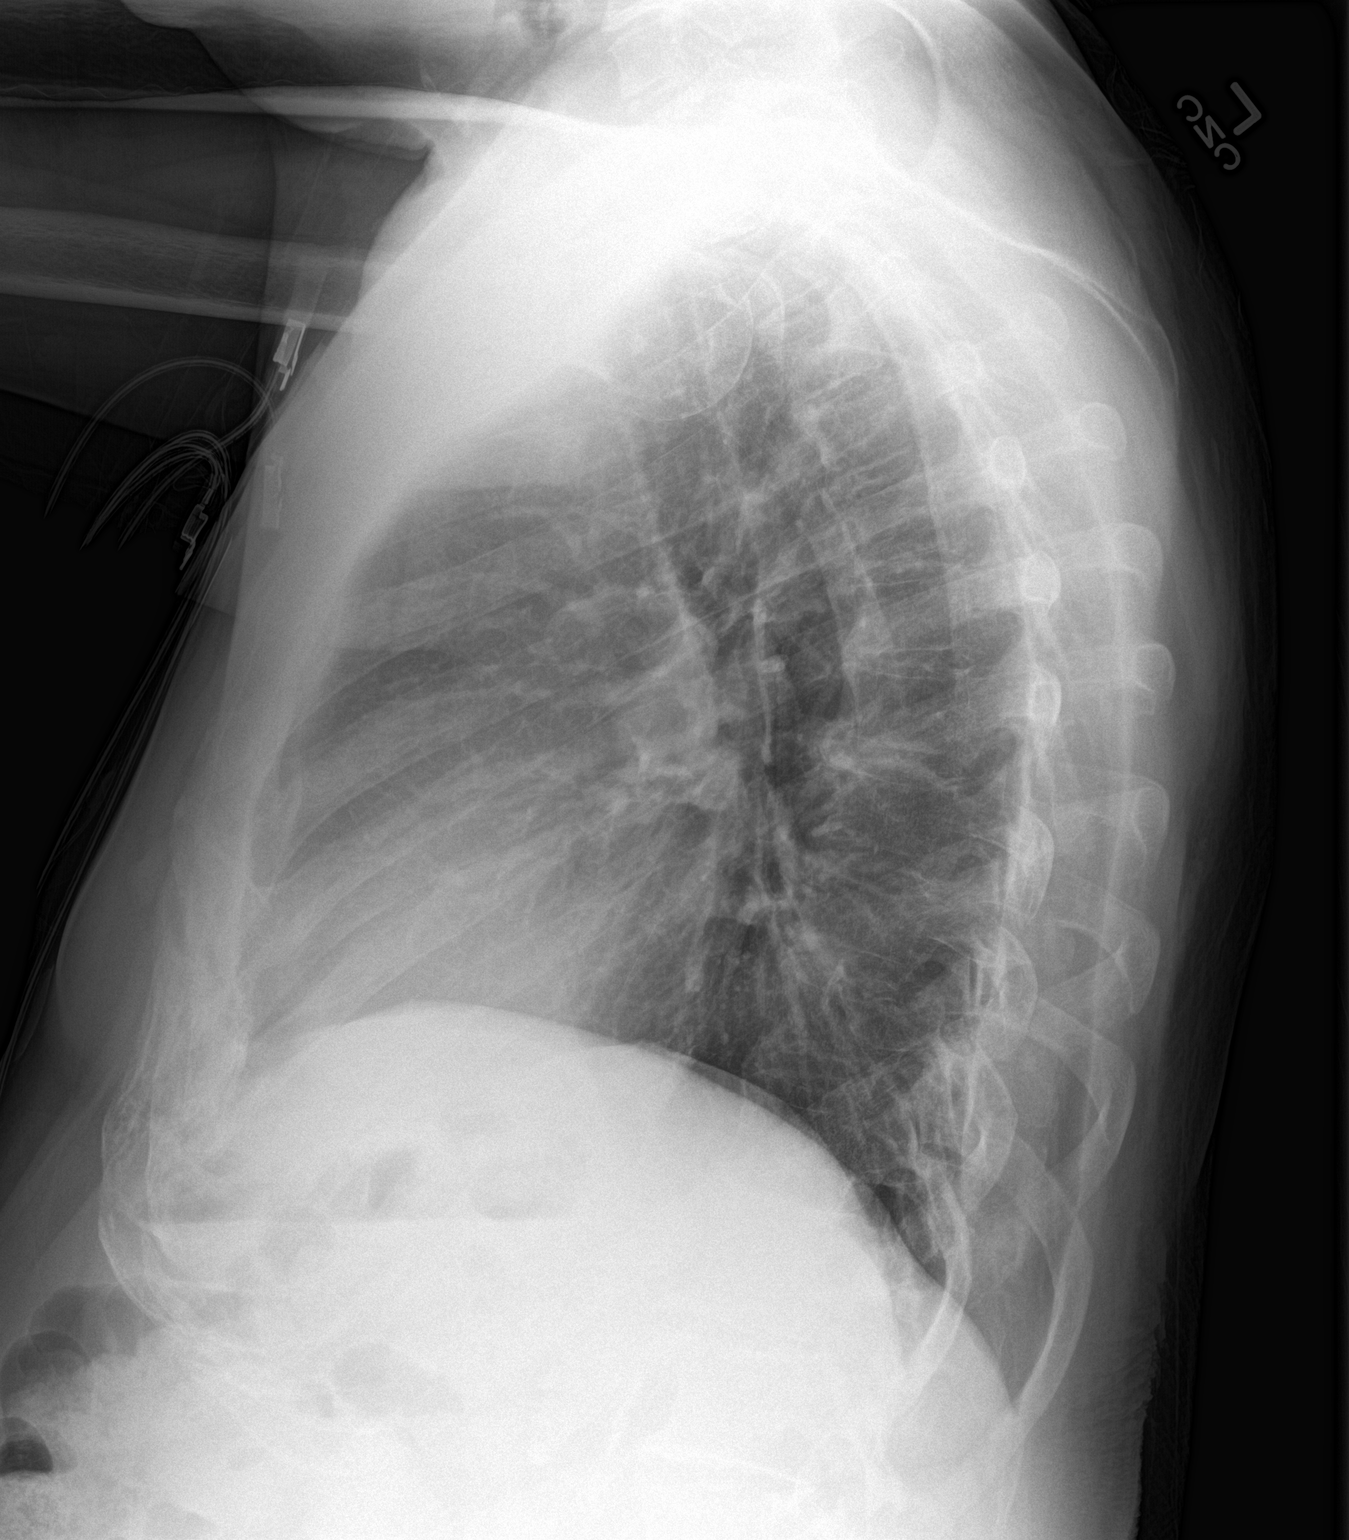

[2 of 2 positions shown; findings below may reference images not displayed]

FINDINGS: Heart size and pulmonary vascularity are normal and the lungs are
clear. No effusions. Are slight thoracolumbar scoliosis.
Calcification in the aortic arch.
IMPRESSION: No acute abnormalities.  Aortic atherosclerosis.

## 2016-04-28 ENCOUNTER — Encounter: Payer: Non-veteran care | Attending: Family | Admitting: *Deleted

## 2016-04-28 VITALS — Ht 72.75 in | Wt 206.5 lb

## 2016-04-28 DIAGNOSIS — Z87891 Personal history of nicotine dependence: Secondary | ICD-10-CM | POA: Insufficient documentation

## 2016-04-28 DIAGNOSIS — R0602 Shortness of breath: Secondary | ICD-10-CM | POA: Diagnosis not present

## 2016-04-28 DIAGNOSIS — I10 Essential (primary) hypertension: Secondary | ICD-10-CM | POA: Diagnosis not present

## 2016-04-28 DIAGNOSIS — E669 Obesity, unspecified: Secondary | ICD-10-CM | POA: Diagnosis not present

## 2016-04-28 DIAGNOSIS — I4891 Unspecified atrial fibrillation: Secondary | ICD-10-CM | POA: Insufficient documentation

## 2016-04-28 DIAGNOSIS — I5042 Chronic combined systolic (congestive) and diastolic (congestive) heart failure: Secondary | ICD-10-CM | POA: Insufficient documentation

## 2016-04-28 DIAGNOSIS — E119 Type 2 diabetes mellitus without complications: Secondary | ICD-10-CM | POA: Diagnosis not present

## 2016-04-28 DIAGNOSIS — I5032 Chronic diastolic (congestive) heart failure: Secondary | ICD-10-CM | POA: Diagnosis present

## 2016-04-28 DIAGNOSIS — Z794 Long term (current) use of insulin: Secondary | ICD-10-CM | POA: Insufficient documentation

## 2016-04-28 DIAGNOSIS — E785 Hyperlipidemia, unspecified: Secondary | ICD-10-CM | POA: Diagnosis not present

## 2016-04-28 NOTE — Progress Notes (Signed)
Cardiac Individual Treatment Plan  Patient Details  Name: Victor [name] MRN: [id number] Date of Birth: 1946/03/31 Referring Provider:   Flowsheet Row Cardiac Rehab from 04/28/2016 in [facility] [place] Cardiac and Pulmonary Rehab  Referring Provider  [facility]      Initial Encounter Date:  Flowsheet Row Cardiac Rehab from 04/28/2016 in [facility] Cardiac and Pulmonary Rehab  Date  04/28/16  Referring Provider  [facility]      Visit Diagnosis: Heart failure, diastolic, chronic (HCC)  Patient's Home Medications on Admission:  Current Outpatient Prescriptions:  .  amLODipine (NORVASC) 10 MG tablet, Take 10 mg by mouth daily., Disp: , Rfl:  .  ammonium lactate (AMLACTIN) 12 % cream, Apply 1 g topically as needed for dry skin. Reported on 10/24/2015, Disp: , Rfl:  .  apixaban (ELIQUIS) 5 MG TABS tablet, Take 5 mg by mouth 2 (two) times daily., Disp: , Rfl:  .  carvedilol (COREG) 25 MG tablet, Take 25 mg by mouth every 12 (twelve) hours., Disp: , Rfl:  .  glipiZIDE (GLUCOTROL) 10 MG tablet, Take 20 mg by mouth 2 (two) times daily., Disp: , Rfl:  .  insulin glargine (LANTUS) 100 UNIT/ML injection, Inject 20 Units into the skin every evening. , Disp: , Rfl:  .  levETIRAcetam (KEPPRA) 750 MG tablet, Take 1 tablet (750 mg total) by mouth 2 (two) times daily., Disp: 60 tablet, Rfl: 0 .  lisinopril (PRINIVIL,ZESTRIL) 20 MG tablet, Take 10 mg by mouth daily., Disp: , Rfl:  .  magnesium oxide (MAG-OX) 400 MG tablet, Take 400 mg by mouth daily., Disp: , Rfl:  .  simvastatin (ZOCOR) 20 MG tablet, Take 10 mg by mouth at bedtime., Disp: , Rfl:  .  allopurinol (ZYLOPRIM) 100 MG tablet, Take 200 mg by mouth daily., Disp: , Rfl:  .  diltiazem (CARDIZEM) 30 MG tablet, Take 1 tablet (30 mg total) by mouth 3 (three) times daily as needed (For HR > 120). (Patient not taking: Reported on 04/28/2016), Disp: 60 tablet, Rfl: 1  Past Medical History: Past Medical History:  Diagnosis Date  . Atrial fibrillation (HCC)    . Chronic systolic CHF (congestive heart failure) (HCC)   . Diabetes mellitus without complication (HCC)   . Hyperlipemia   . Hypertension     Tobacco Use: History  Smoking Status  . Former Smoker  Smokeless Tobacco  . Not on file    Labs: Recent Review Flowsheet Data    There is no flowsheet data to display.       Exercise Target Goals: Date: 04/28/16  Exercise Program Goal: Individual exercise prescription set with THRR, safety & activity barriers. Participant demonstrates ability to understand and report RPE using BORG scale, to self-measure pulse accurately, and to acknowledge the importance of the exercise prescription.  Exercise Prescription Goal: Starting with aerobic activity 30 plus minutes a day, 3 days per week for initial exercise prescription. Provide home exercise prescription and guidelines that participant acknowledges understanding prior to discharge.  Activity Barriers & Risk Stratification:     Activity Barriers & Cardiac Risk Stratification - 04/28/16 1446      Activity Barriers & Cardiac Risk Stratification   Activity Barriers Back Problems;Deconditioning;Muscular Weakness;Shortness of Breath;Balance Concerns;History of Falls;Other (comment);Joint Problems   Comments Wears build up shoe on right side, also has neuropathy, both effect his balance at times, also has hip pain occasionally   Cardiac Risk Stratification High      6 Minute Walk:  6 Minute Walk    Row Name 04/28/16 1442         6 Minute Walk   Phase Initial     Distance 700 feet     Walk Time 5.43 minutes     # of Rest Breaks 3  2 sec, 26 sec, 6 sec     MPH 1.46     METS 1.96     RPE 14     Perceived Dyspnea  5     VO2 Peak 6.85     Symptoms Yes (comment)     Comments tired, SOB, low back and hip pain     Resting HR 69 bpm     Resting BP 134/56     Max Ex. HR 90 bpm     Max Ex. BP 154/84     2 Minute Post BP 146/74        Initial Exercise Prescription:      Initial Exercise Prescription - 04/28/16 1400      Date of Initial Exercise RX and Referring Provider   Date 04/28/16   Referring Provider [facility]     Treadmill   MPH 1   Grade 0   Minutes 15  5 5 5    METs 1.77     NuStep   Level 1   Minutes 15   METs 1.7     Recumbant Elliptical   Level 1   RPM 40   Minutes 15   METs 1.7     Prescription Details   Frequency (times per week) 3   Duration Progress to 45 minutes of aerobic exercise without signs/symptoms of physical distress     Intensity   THRR 40-80% of Max Heartrate 102-135   Ratings of Perceived Exertion 11-15   Perceived Dyspnea 0-4     Progression   Progression Continue to progress workloads to maintain intensity without signs/symptoms of physical distress.     Resistance Training   Training Prescription Yes   Weight 2 lbs   Reps 10-15      Perform Capillary Blood Glucose checks as needed.  Exercise Prescription Changes:     Exercise Prescription Changes    Row Name 04/28/16 1400             Response to Exercise   Blood Pressure (Admit) 135/56       Blood Pressure (Exercise) 158/84       Blood Pressure (Exit) 146/74       Heart Rate (Admit) 69 bpm       Heart Rate (Exercise) 90 bpm       Heart Rate (Exit) 85 bpm       Oxygen Saturation (Exercise) 89 %       Rating of Perceived Exertion (Exercise) 15       Perceived Dyspnea (Exercise) 5          Exercise Comments:     Exercise Comments    Row Name 04/28/16 1448           Exercise Comments Exercise goals include breathing better and able to be more active.          Discharge Exercise Prescription (Final Exercise Prescription Changes):     Exercise Prescription Changes - 04/28/16 1400      Response to Exercise   Blood Pressure (Admit) 135/56   Blood Pressure (Exercise) 158/84   Blood Pressure (Exit) 146/74   Heart Rate (Admit) 69 bpm   Heart Rate (Exercise) 90  bpm   Heart Rate (Exit) 85 bpm   Oxygen Saturation  (Exercise) 89 %   Rating of Perceived Exertion (Exercise) 15   Perceived Dyspnea (Exercise) 5      Nutrition:  Target Goals: Understanding of nutrition guidelines, daily intake of sodium 1500mg , cholesterol 200mg , calories 30% from fat and 7% or less from saturated fats, daily to have 5 or more servings of fruits and vegetables.  Biometrics:     Pre Biometrics - 04/28/16 1445      Pre Biometrics   Height 6' 0.75" (1.848 m)   Weight 206 lb 8 oz (93.7 kg)   Waist Circumference 43.25 inches   Hip Circumference 41 inches   Waist to Hip Ratio 1.05 %   BMI (Calculated) 27.5   Single Leg Stand 4.41 seconds       Nutrition Therapy Plan and Nutrition Goals:     Nutrition Therapy & Goals - 04/28/16 1515      Intervention Plan   Intervention Prescribe, educate and counsel regarding individualized specific dietary modifications aiming towards targeted core components such as weight, hypertension, lipid management, diabetes, heart failure and other comorbidities.   Expected Outcomes Short Term Goal: Understand basic principles of dietary content, such as calories, fat, sodium, cholesterol and nutrients.      Nutrition Discharge: Rate Your Plate Scores:     Nutrition Assessments - 04/28/16 1515      Rate Your Plate Scores   Pre Score 68   Pre Score % 76 %      Nutrition Goals Re-Evaluation:   Psychosocial: Target Goals: Acknowledge presence or absence of depression, maximize coping skills, provide positive support system. Participant is able to verbalize types and ability to use techniques and skills needed for reducing stress and depression.  Initial Review & Psychosocial Screening:     Initial Psych Review & Screening - 04/28/16 1516      Initial Review   Current issues with History of Depression  Depression in 1990 during job loss and lengthy out of work time     [employer] CorporationFamily Dynamics   Good Support System? Yes  3 sons. One lives close other 2 out of state. They all  call on a regular basis.   Concerns Recent loss of significant other  Wife died last year. She had illness for several years,including skilled nursing. Had been married 45 years     Barriers   Psychosocial barriers to participate in program There are no identifiable barriers or psychosocial needs.;The patient should benefit from training in stress management and relaxation.     Screening Interventions   Interventions Encouraged to exercise      Quality of Life Scores:     Quality of Life - 04/28/16 1517      Quality of Life Scores   Health/Function Pre 9.89 %   Socioeconomic Pre 24.29 %   Psych/Spiritual Pre 20.71 %   Family Pre 58.5 %   GLOBAL Pre 17.73 %      PHQ-9: Recent Review Flowsheet Data    [facility]   Decreased Interest 1   Down, Depressed, Hopeless 1   PHQ - 2 Score 2   Altered sleeping 1   Tired, decreased energy 1   Change in appetite 1   Feeling bad or failure about yourself  0   Trouble concentrating 0   Moving slowly or fidgety/restless 0   Suicidal thoughts 0   PHQ-9 Score 5   Difficult doing work/chores Somewhat difficult  Psychosocial Evaluation and Intervention:   Psychosocial Re-Evaluation:   Vocational Rehabilitation: Provide vocational rehab assistance to qualifying candidates.   Vocational Rehab Evaluation & Intervention:     Vocational Rehab - 04/28/16 1525      Initial Vocational Rehab Evaluation & Intervention   Assessment shows need for Vocational Rehabilitation No      Education: Education Goals: Education classes will be provided on a weekly basis, covering required topics. Participant will state understanding/return demonstration of topics presented.  Learning Barriers/Preferences:     Learning Barriers/Preferences - 04/28/16 1525      Learning Barriers/Preferences   Learning Barriers None   Learning Preferences None      Education Topics: General Nutrition Guidelines/Fats and  Fiber: -Group instruction provided by verbal, written material, models and posters to present the general guidelines for heart healthy nutrition. Gives an explanation and review of dietary fats and fiber.   Controlling Sodium/Reading Food Labels: -Group verbal and written material supporting the discussion of sodium use in heart healthy nutrition. Review and explanation with models, verbal and written materials for utilization of the food label.   Exercise Physiology & Risk Factors: - Group verbal and written instruction with models to review the exercise physiology of the cardiovascular system and associated critical values. Details cardiovascular disease risk factors and the goals associated with each risk factor.   Aerobic Exercise & Resistance Training: - Gives group verbal and written discussion on the health impact of inactivity. On the components of aerobic and resistive training programs and the benefits of this training and how to safely progress through these programs.   Flexibility, Balance, General Exercise Guidelines: - Provides group verbal and written instruction on the benefits of flexibility and balance training programs. Provides general exercise guidelines with specific guidelines to those with heart or lung disease. Demonstration and skill practice provided.   Stress Management: - Provides group verbal and written instruction about the health risks of elevated stress, cause of high stress, and healthy ways to reduce stress.   Depression: - Provides group verbal and written instruction on the correlation between heart/lung disease and depressed mood, treatment options, and the stigmas associated with seeking treatment.   Anatomy & Physiology of the Heart: - Group verbal and written instruction and models provide basic cardiac anatomy and physiology, with the coronary electrical and arterial systems. Review of: AMI, Angina, Valve disease, Heart Failure, Cardiac  Arrhythmia, Pacemakers, and the ICD.   Cardiac Procedures: - Group verbal and written instruction and models to describe the testing methods done to diagnose heart disease. Reviews the outcomes of the test results. Describes the treatment choices: Medical Management, Angioplasty, or Coronary Bypass Surgery.   Cardiac Medications: - Group verbal and written instruction to review commonly prescribed medications for heart disease. Reviews the medication, class of the drug, and side effects. Includes the steps to properly store meds and maintain the prescription regimen.   Go Sex-Intimacy & Heart Disease, Get SMART - Goal Setting: - Group verbal and written instruction through game format to discuss heart disease and the return to sexual intimacy. Provides group verbal and written material to discuss and apply goal setting through the application of the S.M.A.R.T. Method.   Other Matters of the Heart: - Provides group verbal, written materials and models to describe Heart Failure, Angina, Valve Disease, and Diabetes in the realm of heart disease. Includes description of the disease process and treatment options available to the cardiac patient.   Exercise & Equipment Safety: - Individual verbal instruction  and demonstration of equipment use and safety with use of the equipment. Flow[facility]  Date  04/28/16  Educator  SB  Instruction Review Code  2- meets goals/outcomes      Infection Prevention: - Provides verbal and written material to individual with discussion of infection control including proper hand washing and proper equipment cleaning during exercise session. Flowsheet [facility]  Date  04/28/16  Educator  SB  Instruction Review Code  2- meets goals/outcomes      Falls Prevention: - Provides verbal and written material to individual with discussion of  falls prevention and safety.   Diabetes: - Individual verbal and written instruction to review signs/symptoms of diabetes, desired ranges of glucose level fasting, after meals and with exercise. Advice that pre and post exercise glucose checks will be done for 3 sessions at entry of program. Flowsheet Row [facility]  Date  04/28/16  Educator  SB  Instruction Review Code  2- meets goals/outcomes       Knowledge Questionnaire Score:     Knowledge Questionnaire Score - 04/28/16 1525      Knowledge Questionnaire Score   Pre Score 24/28      Core Components/Risk Factors/Patient Goals at Admission:     Personal Goals and Risk Factors at Admission - 04/28/16 1330      Core Components/Risk Factors/Patient Goals on Admission    Weight Management Obesity;Weight Maintenance;Yes   Intervention Weight Management: Develop a combined nutrition and exercise program designed to reach desired caloric intake, while maintaining appropriate intake of nutrient and fiber, sodium and fats, and appropriate energy expenditure required for the weight goal.;Weight Management: Provide education and appropriate resources to help participant work on and attain dietary goals.;Weight Management/Obesity: Establish reasonable short term and long term weight goals.;Obesity: Provide education and appropriate resources to help participant work on and attain dietary goals.   Admit Weight 202 lb (91.6 kg)   Goal Weight: Short Term 197 lb (89.4 kg)   Goal Weight: Long Term 180 lb (81.6 kg)   Expected Outcomes Short Term: Continue to assess and modify interventions until short term weight is achieved;Long Term: Adherence to nutrition and physical activity/exercise program aimed toward attainment of established weight goal   Sedentary Yes   Intervention Provide advice, education, support and counseling about physical activity/exercise needs.;Develop an individualized  exercise prescription for aerobic and resistive training based on initial evaluation findings, risk stratification, comorbidities and participant's personal goals.   Expected Outcomes Achievement of increased cardiorespiratory fitness and enhanced flexibility, muscular endurance and strength shown through measurements of functional capacity and personal statement of participant.   Increase Strength and Stamina Yes   Intervention Provide advice, education, support and counseling about physical activity/exercise needs.;Develop an individualized exercise prescription for aerobic and resistive training based on initial evaluation findings, risk stratification, comorbidities and participant's personal goals.   Expected Outcomes Achievement of increased cardiorespiratory fitness and enhanced flexibility, muscular endurance and strength shown through measurements of functional capacity and personal statement of participant.   Improve shortness of breath with ADL's Yes   Intervention Provide education, individualized exercise plan and daily activity instruction to help decrease symptoms of SOB with activities of daily living.   Expected Outcomes Short Term: Achieves a reduction of symptoms when performing activities of daily living.   Diabetes Yes  1990 diagnosed.  Insulin started one  year ago.    Intervention Provide education about signs/symptoms and action to take for hypo/hyperglycemia.;Provide education about proper nutrition, including hydration, and aerobic/resistive exercise prescription along with prescribed medications to achieve blood glucose in normal ranges: Fasting glucose 65-99 mg/dL   Expected Outcomes Short Term: Participant verbalizes understanding of the signs/symptoms and immediate care of hyper/hypoglycemia, proper foot care and importance of medication, aerobic/resistive exercise and nutrition plan for blood glucose control.;Long Term: Attainment of HbA1C < 7%.   Heart Failure Yes    Intervention Provide a combined exercise and nutrition program that is supplemented with education, support and counseling about heart failure. Directed toward relieving symptoms such as shortness of breath, decreased exercise tolerance, and extremity edema.   Expected Outcomes Improve functional capacity of life   Hypertension Yes   Intervention Provide education on lifestyle modifcations including regular physical activity/exercise, weight management, moderate sodium restriction and increased consumption of fresh fruit, vegetables, and low fat dairy, alcohol moderation, and smoking cessation.;Monitor prescription use compliance.   Expected Outcomes Short Term: Continued assessment and intervention until BP is < 140/51mm HG in hypertensive participants. < 130/12mm HG in hypertensive participants with diabetes, heart failure or chronic kidney disease.;Long Term: Maintenance of blood pressure at goal levels.   Lipids Yes   Intervention Provide education and support for participant on nutrition & aerobic/resistive exercise along with prescribed medications to achieve LDL 70mg , HDL >40mg .   Expected Outcomes Short Term: Participant states understanding of desired cholesterol values and is compliant with medications prescribed. Participant is following exercise prescription and nutrition guidelines.;Long Term: Cholesterol controlled with medications as prescribed, with individualized exercise RX and with personalized nutrition plan. Value goals: LDL < 70mg , HDL > 40 mg.      Core Components/Risk Factors/Patient Goals Review:    Core Components/Risk Factors/Patient Goals at Discharge (Final Review):    ITP Comments:     ITP Comments    Row Name 04/28/16 1509           ITP Comments Initial ITP created today during medical review.  Diagnosis documentation notes sent for scan into [facility].          Comments:

## 2016-04-28 NOTE — Patient Instructions (Signed)
Patient Instructions  Patient Details  Name: Victor [name] MRN: [id number] Date of Birth: Oct 09, 1945 Referring Provider:  [facility]  Below are the personal goals you chose as well as exercise and nutrition goals. Our goal is to help you keep on track towards obtaining and maintaining your goals. We will be discussing your progress on these goals with you throughout the program.  Initial Exercise Prescription:     Initial Exercise Prescription - 04/28/16 1400      Date of Initial Exercise RX and Referring Provider   Date 04/28/16   Referring Provider [facility]     Treadmill   MPH 1   Grade 0   Minutes 15  5 5 5    METs 1.77     NuStep   Level 1   Minutes 15   METs 1.7     Recumbant Elliptical   Level 1   RPM 40   Minutes 15   METs 1.7     Prescription Details   Frequency (times per week) 3   Duration Progress to 45 minutes of aerobic exercise without signs/symptoms of physical distress     Intensity   THRR 40-80% of Max Heartrate 102-135   Ratings of Perceived Exertion 11-15   Perceived Dyspnea 0-4     Progression   Progression Continue to progress workloads to maintain intensity without signs/symptoms of physical distress.     Resistance Training   Training Prescription Yes   Weight 2 lbs   Reps 10-15      Exercise Goals: Frequency: Be able to perform aerobic exercise three times per week working toward 3-5 days per week.  Intensity: Work with a perceived exertion of 11 (fairly light) - 15 (hard) as tolerated. Follow your new exercise prescription and watch for changes in prescription as you progress with the program. Changes will be reviewed with you when they are made.  Duration: You should be able to do 30 minutes of continuous aerobic exercise in addition to a 5 minute warm-up and a 5 minute cool-down routine.  Nutrition Goals: Your personal nutrition goals will be established when you do your nutrition analysis with the  dietician.  The following are nutrition guidelines to follow: Cholesterol < 200mg /day Sodium < 1500mg /day Fiber: Men over 50 yrs - 30 grams per day  Personal Goals:     Personal Goals and Risk Factors at Admission - 04/28/16 1330      Core Components/Risk Factors/Patient Goals on Admission    Weight Management Obesity;Weight Maintenance;Yes   Intervention Weight Management: Develop a combined nutrition and exercise program designed to reach desired caloric intake, while maintaining appropriate intake of nutrient and fiber, sodium and fats, and appropriate energy expenditure required for the weight goal.;Weight Management: Provide education and appropriate resources to help participant work on and attain dietary goals.;Weight Management/Obesity: Establish reasonable short term and long term weight goals.;Obesity: Provide education and appropriate resources to help participant work on and attain dietary goals.   Admit Weight 202 lb (91.6 kg)   Goal Weight: Short Term 197 lb (89.4 kg)   Goal Weight: Long Term 180 lb (81.6 kg)   Expected Outcomes Short Term: Continue to assess and modify interventions until short term weight is achieved;Long Term: Adherence to nutrition and physical activity/exercise program aimed toward attainment of established weight goal   Sedentary Yes   Intervention Provide advice, education, support and counseling about physical activity/exercise needs.;Develop an individualized exercise prescription for aerobic and resistive training based  on initial evaluation findings, risk stratification, comorbidities and participant's personal goals.   Expected Outcomes Achievement of increased cardiorespiratory fitness and enhanced flexibility, muscular endurance and strength shown through measurements of functional capacity and personal statement of participant.   Increase Strength and Stamina Yes   Intervention Provide advice, education, support and counseling about physical  activity/exercise needs.;Develop an individualized exercise prescription for aerobic and resistive training based on initial evaluation findings, risk stratification, comorbidities and participant's personal goals.   Expected Outcomes Achievement of increased cardiorespiratory fitness and enhanced flexibility, muscular endurance and strength shown through measurements of functional capacity and personal statement of participant.   Improve shortness of breath with ADL's Yes   Intervention Provide education, individualized exercise plan and daily activity instruction to help decrease symptoms of SOB with activities of daily living.   Expected Outcomes Short Term: Achieves a reduction of symptoms when performing activities of daily living.   Diabetes Yes  1990 diagnosed.  Insulin started one year ago.    Intervention Provide education about signs/symptoms and action to take for hypo/hyperglycemia.;Provide education about proper nutrition, including hydration, and aerobic/resistive exercise prescription along with prescribed medications to achieve blood glucose in normal ranges: Fasting glucose 65-99 mg/dL   Expected Outcomes Short Term: Participant verbalizes understanding of the signs/symptoms and immediate care of hyper/hypoglycemia, proper foot care and importance of medication, aerobic/resistive exercise and nutrition plan for blood glucose control.;Long Term: Attainment of HbA1C < 7%.   Heart Failure Yes   Intervention Provide a combined exercise and nutrition program that is supplemented with education, support and counseling about heart failure. Directed toward relieving symptoms such as shortness of breath, decreased exercise tolerance, and extremity edema.   Expected Outcomes Improve functional capacity of life   Hypertension Yes   Intervention Provide education on lifestyle modifcations including regular physical activity/exercise, weight management, moderate sodium restriction and increased  consumption of fresh fruit, vegetables, and low fat dairy, alcohol moderation, and smoking cessation.;Monitor prescription use compliance.   Expected Outcomes Short Term: Continued assessment and intervention until BP is < 140/6190mm HG in hypertensive participants. < 130/5780mm HG in hypertensive participants with diabetes, heart failure or chronic kidney disease.;Long Term: Maintenance of blood pressure at goal levels.   Lipids Yes   Intervention Provide education and support for participant on nutrition & aerobic/resistive exercise along with prescribed medications to achieve LDL 70mg , HDL >40mg .   Expected Outcomes Short Term: Participant states understanding of desired cholesterol values and is compliant with medications prescribed. Participant is following exercise prescription and nutrition guidelines.;Long Term: Cholesterol controlled with medications as prescribed, with individualized exercise RX and with personalized nutrition plan. Value goals: LDL < 70mg , HDL > 40 mg.      Tobacco Use Initial Evaluation: History  Smoking Status  . Former Smoker  Smokeless Tobacco  . Not on file    Copy of goals given to participant.

## 2016-05-07 DIAGNOSIS — I5042 Chronic combined systolic (congestive) and diastolic (congestive) heart failure: Secondary | ICD-10-CM | POA: Diagnosis not present

## 2016-05-07 DIAGNOSIS — I5032 Chronic diastolic (congestive) heart failure: Secondary | ICD-10-CM

## 2016-05-07 LAB — GLUCOSE, CAPILLARY
GLUCOSE-CAPILLARY: 235 mg/dL — AB (ref 65–99)
GLUCOSE-CAPILLARY: 149 mg/dL — AB (ref 65–99)

## 2016-05-07 NOTE — Progress Notes (Signed)
Daily Session Note  Patient Details  Name: Victor [name] MRN: [id number] Date of Birth: 01/23/1946 Referring Provider:   Flowsheet Row Cardiac Rehab from 04/28/2016 in [facility] Cardiac and Pulmonary Rehab  Referring Provider  [facility]      Encounter Date: 05/07/2016  Check In:     Session Check In - 05/07/16 1721      Check-In   Location [facility]-Cardiac & Pulmonary Rehab   Staff Present Gerlene Burdock, RN, BSN;Brave Dack, DPT, Burlene Arnt, BA, ACSM CEP, Exercise Physiologist   Supervising physician immediately available to respond to emergencies See telemetry face sheet for immediately available ER MD   Medication changes reported     Yes   Fall or balance concerns reported    No   Warm-up and Cool-down Performed on first and last piece of equipment   Resistance Training Performed Yes   VAD Patient? No     Pain Assessment   Currently in Pain? No/denies   Multiple Pain Sites No         Goals Met:  Strength training completed today  Goals Unmet:  Not Applicable  Comments: First full day of exercise!  Patient was oriented to gym and equipment including functions, settings, policies, and procedures.  Patient's individual exercise prescription and treatment plan were reviewed.  All starting workloads were established based on the results of the 6 minute walk test done at initial orientation visit.  The plan for exercise progression was also introduced and progression will be customized based on patient's performance and goals.   Dr. Emily Filbert is Medical Director for [place] and [facility].

## 2016-05-13 ENCOUNTER — Encounter: Payer: Non-veteran care | Attending: Family | Admitting: *Deleted

## 2016-05-13 DIAGNOSIS — I4891 Unspecified atrial fibrillation: Secondary | ICD-10-CM | POA: Insufficient documentation

## 2016-05-13 DIAGNOSIS — I5032 Chronic diastolic (congestive) heart failure: Secondary | ICD-10-CM

## 2016-05-13 DIAGNOSIS — E119 Type 2 diabetes mellitus without complications: Secondary | ICD-10-CM | POA: Diagnosis not present

## 2016-05-13 DIAGNOSIS — I10 Essential (primary) hypertension: Secondary | ICD-10-CM | POA: Insufficient documentation

## 2016-05-13 DIAGNOSIS — Z794 Long term (current) use of insulin: Secondary | ICD-10-CM | POA: Diagnosis not present

## 2016-05-13 DIAGNOSIS — R0602 Shortness of breath: Secondary | ICD-10-CM | POA: Diagnosis not present

## 2016-05-13 DIAGNOSIS — Z87891 Personal history of nicotine dependence: Secondary | ICD-10-CM | POA: Insufficient documentation

## 2016-05-13 DIAGNOSIS — I5042 Chronic combined systolic (congestive) and diastolic (congestive) heart failure: Secondary | ICD-10-CM | POA: Diagnosis not present

## 2016-05-13 DIAGNOSIS — E669 Obesity, unspecified: Secondary | ICD-10-CM | POA: Diagnosis not present

## 2016-05-13 DIAGNOSIS — E785 Hyperlipidemia, unspecified: Secondary | ICD-10-CM | POA: Insufficient documentation

## 2016-05-13 LAB — GLUCOSE, CAPILLARY
GLUCOSE-CAPILLARY: 120 mg/dL — AB (ref 65–99)
Glucose-Capillary: 102 mg/dL — ABNORMAL HIGH (ref 65–99)

## 2016-05-13 NOTE — Progress Notes (Signed)
Daily Session Note  Patient Details  Name: BENTON TOOKER MRN: [id number] Date of Birth: 1946-01-11 Referring Provider:   Flowsheet Row Cardiac Rehab from 04/28/2016 in [facility] Cardiac and Pulmonary Rehab  Referring Provider  [facility]      Encounter Date: 05/13/2016  Check In:     Session Check In - 05/13/16 1821      Check-In   Location [facility]-Cardiac & Pulmonary Rehab   Staff Present Heath Lark, RN, BSN, CCRP;Carroll Enterkin, RN, Vickki Hearing, BA, ACSM CEP, Exercise Physiologist   Supervising physician immediately available to respond to emergencies See telemetry face sheet for immediately available ER MD   Medication changes reported     No   Fall or balance concerns reported    No   Warm-up and Cool-down Performed on first and last piece of equipment   Resistance Training Performed Yes   VAD Patient? No     Pain Assessment   Currently in Pain? No/denies         Goals Met:  Exercise tolerated well No report of cardiac concerns or symptoms Strength training completed today  Goals Unmet:  Not Applicable  Comments: Doing well with exercise prescription progression.    Dr. Emily Filbert is Medical Director for [facility] and [facility].

## 2016-05-14 ENCOUNTER — Encounter: Payer: Non-veteran care | Admitting: *Deleted

## 2016-05-14 DIAGNOSIS — I5032 Chronic diastolic (congestive) heart failure: Secondary | ICD-10-CM

## 2016-05-14 DIAGNOSIS — I5042 Chronic combined systolic (congestive) and diastolic (congestive) heart failure: Secondary | ICD-10-CM | POA: Diagnosis not present

## 2016-05-14 LAB — GLUCOSE, CAPILLARY
GLUCOSE-CAPILLARY: 139 mg/dL — AB (ref 65–99)
Glucose-Capillary: 244 mg/dL — ABNORMAL HIGH (ref 65–99)

## 2016-05-14 NOTE — Progress Notes (Signed)
Daily Session Note  Patient Details  Name: Victor [name] MRN: [id number] Date of Birth: 10-14-1945 Referring Provider:   Flowsheet Row Cardiac Rehab from 04/28/2016 in [facility] Cardiac and Pulmonary Rehab  Referring Provider  [facility]      Encounter Date: 05/14/2016  Check In:     Session Check In - 05/14/16 1719      Check-In   Location [facility]-Cardiac & Pulmonary Rehab   Staff Present Alberteen Sam, MA, ACSM RCEP, Exercise Physiologist;Amanda Oletta Darter, BA, ACSM CEP, Exercise Physiologist;Carroll Enterkin, RN, BSN   Supervising physician immediately available to respond to emergencies See telemetry face sheet for immediately available ER MD   Medication changes reported     No   Fall or balance concerns reported    No   Warm-up and Cool-down Performed on first and last piece of equipment   Resistance Training Performed Yes   VAD Patient? No     Pain Assessment   Currently in Pain? No/denies   Multiple Pain Sites No           Exercise Prescription Changes - 05/14/16 1200      Exercise Review   Progression Yes     Response to Exercise   Blood Pressure (Admit) 128/64   Blood Pressure (Exercise) 158/82   Blood Pressure (Exit) 128/64   Heart Rate (Admit) 80 bpm   Heart Rate (Exercise) 135 bpm   Heart Rate (Exit) 68 bpm   Rating of Perceived Exertion (Exercise) 13   Symptoms none   Duration Progress to 45 minutes of aerobic exercise without signs/symptoms of physical distress   Intensity THRR unchanged     Resistance Training   Training Prescription Yes   Weight 2   Reps 10-15     Interval Training   Interval Training No     Treadmill   MPH 1   Grade 0   Minutes 15   METs 1.77     NuStep   Level 1   Minutes 15   METs 1.7      Goals Met:  Independence with exercise equipment Exercise tolerated well No report of cardiac concerns or symptoms Strength training completed today  Goals Unmet:  Not Applicable  Comments: Pt able to follow exercise  prescription today without complaint.  Will continue to monitor for progression.    Dr. Emily Filbert is Medical Director for [place] and [facility].

## 2016-05-18 ENCOUNTER — Encounter: Payer: Non-veteran care | Admitting: *Deleted

## 2016-05-18 DIAGNOSIS — I5032 Chronic diastolic (congestive) heart failure: Secondary | ICD-10-CM

## 2016-05-18 DIAGNOSIS — I5042 Chronic combined systolic (congestive) and diastolic (congestive) heart failure: Secondary | ICD-10-CM | POA: Diagnosis not present

## 2016-05-18 NOTE — Progress Notes (Signed)
Daily Session Note  Patient Details  Name: SABIN GIBEAULT MRN: [id number] Date of Birth: 16-Oct-1945 Referring Provider:   Flowsheet Row Cardiac Rehab from 04/28/2016 in [facility] Cardiac and Pulmonary Rehab  Referring Provider  [facility]      Encounter Date: 05/18/2016  Check In:     Session Check In - 05/18/16 1646      Check-In   Location [facility]-Cardiac & Pulmonary Rehab   Staff Present Gerlene Burdock, RN, BSN;Susanne Bice, RN, BSN, Laveda Norman, BS, ACSM CEP, Exercise Physiologist   Supervising physician immediately available to respond to emergencies See telemetry face sheet for immediately available ER MD   Medication changes reported     No   Fall or balance concerns reported    No   Warm-up and Cool-down Performed on first and last piece of equipment   Resistance Training Performed Yes   VAD Patient? No     Pain Assessment   Currently in Pain? No/denies   Multiple Pain Sites No         Goals Met:  Independence with exercise equipment Exercise tolerated well No report of cardiac concerns or symptoms Strength training completed today  Goals Unmet:  Not Applicable  Comments: Pt able to follow exercise prescription today without complaint.  Will continue to monitor for progression.    Dr. Emily Filbert is Medical Director for [place] and [facility].

## 2016-05-20 ENCOUNTER — Encounter: Payer: Self-pay | Admitting: *Deleted

## 2016-05-20 ENCOUNTER — Encounter: Payer: Non-veteran care | Admitting: *Deleted

## 2016-05-20 DIAGNOSIS — I5032 Chronic diastolic (congestive) heart failure: Secondary | ICD-10-CM

## 2016-05-20 DIAGNOSIS — I5042 Chronic combined systolic (congestive) and diastolic (congestive) heart failure: Secondary | ICD-10-CM | POA: Diagnosis not present

## 2016-05-20 NOTE — Progress Notes (Signed)
Cardiac Individual Treatment Plan  Patient Details  Name: Victor [name] MRN: [id number] Date of Birth: 23-Aug-1946 Referring Provider:   Flowsheet Row Cardiac Rehab from 04/28/2016 in [facility] Cardiac and Pulmonary Rehab  Referring Provider  [facility]      Initial Encounter Date:  [place] from 04/28/2016 in [facility] Cardiac and Pulmonary Rehab  Date  04/28/16  Referring Provider  [facility]      Visit Diagnosis: Heart failure, diastolic, chronic ([place])  Patient's Home Medications on Admission:  Current Outpatient Prescriptions:  .  allopurinol (ZYLOPRIM) 100 MG tablet, Take 200 mg by mouth daily., Disp: , Rfl:  .  amLODipine (NORVASC) 10 MG tablet, Take 10 mg by mouth daily., Disp: , Rfl:  .  ammonium lactate (AMLACTIN) 12 % cream, Apply 1 g topically as needed for dry skin. Reported on 10/24/2015, Disp: , Rfl:  .  apixaban (ELIQUIS) 5 MG TABS tablet, Take 5 mg by mouth 2 (two) times daily., Disp: , Rfl:  .  carvedilol (COREG) 25 MG tablet, Take 25 mg by mouth every 12 (twelve) hours., Disp: , Rfl:  .  diltiazem (CARDIZEM) 30 MG tablet, Take 1 tablet (30 mg total) by mouth 3 (three) times daily as needed (For HR > 120). (Patient not taking: Reported on 04/28/2016), Disp: 60 tablet, Rfl: 1 .  glipiZIDE (GLUCOTROL) 10 MG tablet, Take 20 mg by mouth 2 (two) times daily., Disp: , Rfl:  .  insulin glargine (LANTUS) 100 UNIT/ML injection, Inject 20 Units into the skin every evening. , Disp: , Rfl:  .  levETIRAcetam (KEPPRA) 750 MG tablet, Take 1 tablet (750 mg total) by mouth 2 (two) times daily., Disp: 60 tablet, Rfl: 0 .  lisinopril (PRINIVIL,ZESTRIL) 20 MG tablet, Take 10 mg by mouth daily., Disp: , Rfl:  .  magnesium oxide (MAG-OX) 400 MG tablet, Take 400 mg by mouth daily., Disp: , Rfl:  .  simvastatin (ZOCOR) 20 MG tablet, Take 10 mg by mouth at bedtime., Disp: , Rfl:   Past Medical History: Past Medical History:  Diagnosis Date  . Atrial fibrillation ([place])    . Chronic systolic CHF (congestive heart failure) ([place])   . Diabetes mellitus without complication ([place])   . Hyperlipemia   . Hypertension     Tobacco Use: History  Smoking Status  . Former Smoker  Smokeless Tobacco  . Not on file    Labs: Recent Review Flowsheet Data    There is no flowsheet data to display.       Exercise Target Goals:    Exercise Program Goal: Individual exercise prescription set with THRR, safety & activity barriers. Participant demonstrates ability to understand and report RPE using BORG scale, to self-measure pulse accurately, and to acknowledge the importance of the exercise prescription.  Exercise Prescription Goal: Starting with aerobic activity 30 plus minutes a day, 3 days per week for initial exercise prescription. Provide home exercise prescription and guidelines that participant acknowledges understanding prior to discharge.  Activity Barriers & Risk Stratification:     Activity Barriers & Cardiac Risk Stratification - 04/28/16 1446      Activity Barriers & Cardiac Risk Stratification   Activity Barriers Back Problems;Deconditioning;Muscular Weakness;Shortness of Breath;Balance Concerns;History of Falls;Other (comment);Joint Problems   Comments Wears build up shoe on right side, also has neuropathy, both effect his balance at times, also has hip pain occasionally   Cardiac Risk Stratification High      6 Minute Walk:  6 Minute Walk    Row Name 04/28/16 1442         6 Minute Walk   Phase Initial     Distance 700 feet     Walk Time 5.43 minutes     # of Rest Breaks 3  2 sec, 26 sec, 6 sec     MPH 1.46     METS 1.96     RPE 14     Perceived Dyspnea  5     VO2 Peak 6.85     Symptoms Yes (comment)     Comments tired, SOB, low back and hip pain     Resting HR 69 bpm     Resting BP 134/56     Max Ex. HR 90 bpm     Max Ex. BP 154/84     2 Minute Post BP 146/74        Initial Exercise Prescription:     Initial  Exercise Prescription - 04/28/16 1400      Date of Initial Exercise RX and Referring Provider   Date 04/28/16   Referring Provider [facility]     Treadmill   MPH 1   Grade 0   Minutes 15  5 5 5    METs 1.77     NuStep   Level 1   Minutes 15   METs 1.7     Recumbant Elliptical   Level 1   RPM 40   Minutes 15   METs 1.7     Prescription Details   Frequency (times per week) 3   Duration Progress to 45 minutes of aerobic exercise without signs/symptoms of physical distress     Intensity   THRR 40-80% of Max Heartrate 102-135   Ratings of Perceived Exertion 11-15   Perceived Dyspnea 0-4     Progression   Progression Continue to progress workloads to maintain intensity without signs/symptoms of physical distress.     Resistance Training   Training Prescription Yes   Weight 2 lbs   Reps 10-15      Perform Capillary Blood Glucose checks as needed.  Exercise Prescription Changes:     Exercise Prescription Changes    Row Name 04/28/16 1400 05/14/16 1200           Exercise Review   Progression  - Yes        Response to Exercise   Blood Pressure (Admit) 135/56 128/64      Blood Pressure (Exercise) 158/84 158/82      Blood Pressure (Exit) 146/74 128/64      Heart Rate (Admit) 69 bpm 80 bpm      Heart Rate (Exercise) 90 bpm 135 bpm      Heart Rate (Exit) 85 bpm 68 bpm      Oxygen Saturation (Exercise) 89 %  -      Rating of Perceived Exertion (Exercise) 15 13      Perceived Dyspnea (Exercise) 5  -      Symptoms  - none      Duration  - Progress to 45 minutes of aerobic exercise without signs/symptoms of physical distress      Intensity  - THRR unchanged        Resistance Training   Training Prescription  - Yes      Weight  - 2      Reps  - 10-15        Interval Training   Interval Training  - No  Treadmill   MPH  - 1      Grade  - 0      Minutes  - 15      METs  - 1.77        NuStep   Level  - 1      Minutes  - 15      METs  - 1.7          Exercise Comments:     Exercise Comments    Row Name 04/28/16 1448 05/14/16 1205         Exercise Comments Exercise goals include breathing better and able to be more active. [place] is progressing well with exercise.         Discharge Exercise Prescription (Final Exercise Prescription Changes):     Exercise Prescription Changes - 05/14/16 1200      Exercise Review   Progression Yes     Response to Exercise   Blood Pressure (Admit) 128/64   Blood Pressure (Exercise) 158/82   Blood Pressure (Exit) 128/64   Heart Rate (Admit) 80 bpm   Heart Rate (Exercise) 135 bpm   Heart Rate (Exit) 68 bpm   Rating of Perceived Exertion (Exercise) 13   Symptoms none   Duration Progress to 45 minutes of aerobic exercise without signs/symptoms of physical distress   Intensity THRR unchanged     Resistance Training   Training Prescription Yes   Weight 2   Reps 10-15     Interval Training   Interval Training No     Treadmill   MPH 1   Grade 0   Minutes 15   METs 1.77     NuStep   Level 1   Minutes 15   METs 1.7      Nutrition:  Target Goals: Understanding of nutrition guidelines, daily intake of sodium 1500mg , cholesterol 200mg , calories 30% from fat and 7% or less from saturated fats, daily to have 5 or more servings of fruits and vegetables.  Biometrics:     Pre Biometrics - 04/28/16 1445      Pre Biometrics   Height 6' 0.75" (1.848 m)   Weight 206 lb 8 oz (93.7 kg)   Waist Circumference 43.25 inches   Hip Circumference 41 inches   Waist to Hip Ratio 1.05 %   BMI (Calculated) 27.5   Single Leg Stand 4.41 seconds       Nutrition Therapy Plan and Nutrition Goals:     Nutrition Therapy & Goals - 04/28/16 1515      Intervention Plan   Intervention Prescribe, educate and counsel regarding individualized specific dietary modifications aiming towards targeted core components such as weight, hypertension, lipid management, diabetes, heart failure and  other comorbidities.   Expected Outcomes Short Term Goal: Understand basic principles of dietary content, such as calories, fat, sodium, cholesterol and nutrients.      Nutrition Discharge: Rate Your Plate Scores:     Nutrition Assessments - 04/28/16 1515      Rate Your Plate Scores   Pre Score 68   Pre Score % 76 %      Nutrition Goals Re-Evaluation:     Nutrition Goals Re-Evaluation    Row Name 05/07/16 1805             Personal Goal #1 Re-Evaluation   Personal Goal #1 Heart healthy diet       Comments Dhyan mentioned that he went to the nursing home about every day for 3 years when  his wife was in the nursing home before she died. Amere said he got used to feeding her lunch then returning home and having his lunch at 2pm. Nasean has diabetes.          Psychosocial: Target Goals: Acknowledge presence or absence of depression, maximize coping skills, provide positive support system. Participant is able to verbalize types and ability to use techniques and skills needed for reducing stress and depression.  Initial Review & Psychosocial Screening:     Initial Psych Review & Screening - 05/07/16 1804      Family Dynamics   Comments Jaquann mentioned that he went to the nursing home about every day for 3 years when his wife was in the nursing home before she died. Demontrae said he got used to feeding her lunch then returning home and having his lunch at 2pm      Quality of Life Scores:     Quality of Life - 04/28/16 1517      Quality of Life Scores   Health/Function Pre 9.89 %   Socioeconomic Pre 24.29 %   Psych/Spiritual Pre 20.71 %   Family Pre 58.5 %   GLOBAL Pre 17.73 %      PHQ-9: Recent Review Flowsheet Data    Depression screen [facility] 2/9 04/28/2016   Decreased Interest 1   Down, Depressed, Hopeless 1   PHQ - 2 Score 2   Altered sleeping 1   Tired, decreased energy 1   Change in appetite 1   Feeling bad or failure about yourself  0   Trouble concentrating  0   Moving slowly or fidgety/restless 0   Suicidal thoughts 0   PHQ-9 Score 5   Difficult doing work/chores Somewhat difficult      Psychosocial Evaluation and Intervention:   Psychosocial Re-Evaluation:   Vocational Rehabilitation: Provide vocational rehab assistance to qualifying candidates.   Vocational Rehab Evaluation & Intervention:     Vocational Rehab - 04/28/16 1525      Initial Vocational Rehab Evaluation & Intervention   Assessment shows need for Vocational Rehabilitation No      Education: Education Goals: Education classes will be provided on a weekly basis, covering required topics. Participant will state understanding/return demonstration of topics presented.  Learning Barriers/Preferences:     Learning Barriers/Preferences - 04/28/16 1525      Learning Barriers/Preferences   Learning Barriers None   Learning Preferences None      Education Topics: General Nutrition Guidelines/Fats and Fiber: -Group instruction provided by verbal, written material, models and posters to present the general guidelines for heart healthy nutrition. Gives an explanation and review of dietary fats and fiber.   Controlling Sodium/Reading Food Labels: -Group verbal and written material supporting the discussion of sodium use in heart healthy nutrition. Review and explanation with models, verbal and written materials for utilization of the food label.   Exercise Physiology & Risk Factors: - Group verbal and written instruction with models to review the exercise physiology of the cardiovascular system and associated critical values. Details cardiovascular disease risk factors and the goals associated with each risk factor.   Aerobic Exercise & Resistance Training: - Gives group verbal and written discussion on the health impact of inactivity. On the components of aerobic and resistive training programs and the benefits of this training and how to safely progress through  these programs.   Flexibility, Balance, General Exercise Guidelines: - Provides group verbal and written instruction on the benefits of flexibility and balance training programs.  Provides general exercise guidelines with specific guidelines to those with heart or lung disease. Demonstration and skill practice provided.   Stress Management: - Provides group verbal and written instruction about the health risks of elevated stress, cause of high stress, and healthy ways to reduce stress.   Depression: - Provides group verbal and written instruction on the correlation between heart/lung disease and depressed mood, treatment options, and the stigmas associated with seeking treatment.   Anatomy & Physiology of the Heart: - Group verbal and written instruction and models provide basic cardiac anatomy and physiology, with the coronary electrical and arterial systems. Review of: AMI, Angina, Valve disease, Heart Failure, Cardiac Arrhythmia, Pacemakers, and the ICD.   Cardiac Procedures: - Group verbal and written instruction and models to describe the testing methods done to diagnose heart disease. Reviews the outcomes of the test results. Describes the treatment choices: Medical Management, Angioplasty, or Coronary Bypass Surgery.   Cardiac Medications: - Group verbal and written instruction to review commonly prescribed medications for heart disease. Reviews the medication, class of the drug, and side effects. Includes the steps to properly store meds and maintain the prescription regimen. Flowsheet Row Cardiac Rehab from 05/18/2016 in [facility] Cardiac and Pulmonary Rehab  Date  05/13/16  Educator  SB  Instruction Review Code  2- meets goals/outcomes      Go Sex-Intimacy & Heart Disease, Get SMART - Goal Setting: - Group verbal and written instruction through game format to discuss heart disease and the return to sexual intimacy. Provides group verbal and written material to discuss and apply  goal setting through the application of the S.M.A.R.T. Method.   Other Matters of the Heart: - Provides group verbal, written materials and models to describe Heart Failure, Angina, Valve Disease, and Diabetes in the realm of heart disease. Includes description of the disease process and treatment options available to the cardiac patient.   Exercise & Equipment Safety: - Individual verbal instruction and demonstration of equipment use and safety with use of the equipment. Flowsheet Row Cardiac Rehab from 05/18/2016 in [facility] Cardiac and Pulmonary Rehab  Date  04/28/16  Educator  SB  Instruction Review Code  2- meets goals/outcomes      Infection Prevention: - Provides verbal and written material to individual with discussion of infection control including proper hand washing and proper equipment cleaning during exercise session. Flowsheet Row Cardiac Rehab from 05/18/2016 in [facility] Cardiac and Pulmonary Rehab  Date  04/28/16  Educator  SB  Instruction Review Code  2- meets goals/outcomes      Falls Prevention: - Provides verbal and written material to individual with discussion of falls prevention and safety.   Diabetes: - Individual verbal and written instruction to review signs/symptoms of diabetes, desired ranges of glucose level fasting, after meals and with exercise. Advice that pre and post exercise glucose checks will be done for 3 sessions at entry of program. Flowsheet Row Cardiac Rehab from 05/18/2016 in [facility] Cardiac and Pulmonary Rehab  Date  04/28/16  Educator  SB  Instruction Review Code  2- meets goals/outcomes       Knowledge Questionnaire Score:     Knowledge Questionnaire Score - 04/28/16 1525      Knowledge Questionnaire Score   Pre Score 24/28      Core Components/Risk Factors/Patient Goals at Admission:     Personal Goals and Risk Factors at Admission - 04/28/16 1330      Core Components/Risk Factors/Patient Goals on Admission    Weight Management  Obesity;Weight Maintenance;Yes   Intervention Weight Management: Develop a combined nutrition and exercise program designed to reach desired caloric intake, while maintaining appropriate intake of nutrient and fiber, sodium and fats, and appropriate energy expenditure required for the weight goal.;Weight Management: Provide education and appropriate resources to help participant work on and attain dietary goals.;Weight Management/Obesity: Establish reasonable short term and long term weight goals.;Obesity: Provide education and appropriate resources to help participant work on and attain dietary goals.   Admit Weight 202 lb (91.6 kg)   Goal Weight: Short Term 197 lb (89.4 kg)   Goal Weight: Long Term 180 lb (81.6 kg)   Expected Outcomes Short Term: Continue to assess and modify interventions until short term weight is achieved;Long Term: Adherence to nutrition and physical activity/exercise program aimed toward attainment of established weight goal   Sedentary Yes   Intervention Provide advice, education, support and counseling about physical activity/exercise needs.;Develop an individualized exercise prescription for aerobic and resistive training based on initial evaluation findings, risk stratification, comorbidities and participant's personal goals.   Expected Outcomes Achievement of increased cardiorespiratory fitness and enhanced flexibility, muscular endurance and strength shown through measurements of functional capacity and personal statement of participant.   Increase Strength and Stamina Yes   Intervention Provide advice, education, support and counseling about physical activity/exercise needs.;Develop an individualized exercise prescription for aerobic and resistive training based on initial evaluation findings, risk stratification, comorbidities and participant's personal goals.   Expected Outcomes Achievement of increased cardiorespiratory fitness and enhanced flexibility, muscular endurance  and strength shown through measurements of functional capacity and personal statement of participant.   Improve shortness of breath with ADL's Yes   Intervention Provide education, individualized exercise plan and daily activity instruction to help decrease symptoms of SOB with activities of daily living.   Expected Outcomes Short Term: Achieves a reduction of symptoms when performing activities of daily living.   Diabetes Yes  1990 diagnosed.  Insulin started one year ago.    Intervention Provide education about signs/symptoms and action to take for hypo/hyperglycemia.;Provide education about proper nutrition, including hydration, and aerobic/resistive exercise prescription along with prescribed medications to achieve blood glucose in normal ranges: Fasting glucose 65-99 mg/dL   Expected Outcomes Short Term: Participant verbalizes understanding of the signs/symptoms and immediate care of hyper/hypoglycemia, proper foot care and importance of medication, aerobic/resistive exercise and nutrition plan for blood glucose control.;Long Term: Attainment of HbA1C < 7%.   Heart Failure Yes   Intervention Provide a combined exercise and nutrition program that is supplemented with education, support and counseling about heart failure. Directed toward relieving symptoms such as shortness of breath, decreased exercise tolerance, and extremity edema.   Expected Outcomes Improve functional capacity of life   Hypertension Yes   Intervention Provide education on lifestyle modifcations including regular physical activity/exercise, weight management, moderate sodium restriction and increased consumption of fresh fruit, vegetables, and low fat dairy, alcohol moderation, and smoking cessation.;Monitor prescription use compliance.   Expected Outcomes Short Term: Continued assessment and intervention until BP is < 140/7090mm HG in hypertensive participants. < 130/3180mm HG in hypertensive participants with diabetes, heart  failure or chronic kidney disease.;Long Term: Maintenance of blood pressure at goal levels.   Lipids Yes   Intervention Provide education and support for participant on nutrition & aerobic/resistive exercise along with prescribed medications to achieve LDL 70mg , HDL >40mg .   Expected Outcomes Short Term: Participant states understanding of desired cholesterol values and is compliant with medications prescribed. Participant is following exercise prescription and nutrition  guidelines.;Long Term: Cholesterol controlled with medications as prescribed, with individualized exercise RX and with personalized nutrition plan. Value goals: LDL < 70mg , HDL > 40 mg.      Core Components/Risk Factors/Patient Goals Review:      Goals and Risk Factor Review    Row Name 05/18/16 1802             Core Components/Risk Factors/Patient Goals Review   Personal Goals Review Weight Management/Obesity;Diabetes;Lipids;Heart Failure       Review Spoke with Salim today about his goals.   [place] has seen weight drop seveal poundds and is not seeing fluid retention of 2-3 pound flucuation since has changed diurectic. Diabetes is staying well controlled along with cholesterol and heart failure symptoms       Expected Outcomes Continue with weight loss following exercise and nutrition plans. Maintian control of heart failure symptoms, cholesterol levels, through prescribed meds. exercise and nutrition plans.          Core Components/Risk Factors/Patient Goals at Discharge (Final Review):      Goals and Risk Factor Review - 05/18/16 1802      Core Components/Risk Factors/Patient Goals Review   Personal Goals Review Weight Management/Obesity;Diabetes;Lipids;Heart Failure   Review Spoke with [place] today about his goals.   [place] has seen weight drop seveal poundds and is not seeing fluid retention of 2-3 pound flucuation since has changed diurectic. Diabetes is staying well controlled along with cholesterol and heart  failure symptoms   Expected Outcomes Continue with weight loss following exercise and nutrition plans. Maintian control of heart failure symptoms, cholesterol levels, through prescribed meds. exercise and nutrition plans.      ITP Comments:     ITP Comments    Row Name 04/28/16 1509 05/14/16 1205 05/20/16 0716       ITP Comments Initial ITP created today during medical review.  Diagnosis documentation notes sent for scan into [facility]. [place] is progressing well with exercise. 30 day review. Continue with ITP unless changes noted by Medical Director at signature of review.        Comments:

## 2016-05-20 NOTE — Progress Notes (Signed)
Daily Session Note  Patient Details  Name: MADDEN GARRON MRN: [id number] Date of Birth: 1946-07-01 Referring Provider:   Flowsheet Row Cardiac Rehab from 04/28/2016 in [facility] Cardiac and Pulmonary Rehab  Referring Provider  [facility]      Encounter Date: 05/20/2016  Check In:     Session Check In - 05/20/16 1710      Check-In   Location [facility]-Cardiac & Pulmonary Rehab   Staff Present Nyoka Cowden, RN, BSN, MA;Pairlee Sawtell, RN, Vickki Hearing, BA, ACSM CEP, Exercise Physiologist   Supervising physician immediately available to respond to emergencies See telemetry face sheet for immediately available ER MD   Medication changes reported     No   Fall or balance concerns reported    No   Warm-up and Cool-down Performed on first and last piece of equipment   Resistance Training Performed Yes   VAD Patient? No     Pain Assessment   Currently in Pain? No/denies         Goals Met:  Proper associated with RPD/PD & O2 Sat No report of cardiac concerns or symptoms  Goals Unmet:  Not Applicable  Comments:    Dr. Emily Filbert is Medical Director for [place] and [facility].

## 2016-05-21 ENCOUNTER — Encounter: Payer: Non-veteran care | Admitting: *Deleted

## 2016-05-21 DIAGNOSIS — I5042 Chronic combined systolic (congestive) and diastolic (congestive) heart failure: Secondary | ICD-10-CM | POA: Diagnosis not present

## 2016-05-21 DIAGNOSIS — I5032 Chronic diastolic (congestive) heart failure: Secondary | ICD-10-CM

## 2016-05-21 NOTE — Progress Notes (Signed)
Daily Session Note  Patient Details  Name: Victor [name] MRN: [id number] Date of Birth: 08-25-1946 Referring Provider:   Flowsheet Row Cardiac Rehab from 04/28/2016 in [facility] Cardiac and Pulmonary Rehab  Referring Provider  [facility]      Encounter Date: 05/21/2016  Check In:     Session Check In - 05/21/16 1711      Check-In   Location [facility]-Cardiac & Pulmonary Rehab   Staff Present Nyoka Cowden, RN, BSN, MA;Prince Olivier, RN, Vickki Hearing, BA, ACSM CEP, Exercise Physiologist   Supervising physician immediately available to respond to emergencies See telemetry face sheet for immediately available ER MD   Medication changes reported     No   Fall or balance concerns reported    No   Warm-up and Cool-down Performed on first and last piece of equipment   Resistance Training Performed Yes   VAD Patient? No     Pain Assessment   Currently in Pain? No/denies         Goals Met:  Proper associated with RPD/PD & O2 Sat Exercise tolerated well No report of cardiac concerns or symptoms  Goals Unmet:  Not Applicable  Comments:     Dr. Emily Filbert is Medical Director for [place] and [facility].

## 2016-05-25 ENCOUNTER — Encounter: Payer: Non-veteran care | Admitting: *Deleted

## 2016-05-25 DIAGNOSIS — I5042 Chronic combined systolic (congestive) and diastolic (congestive) heart failure: Secondary | ICD-10-CM | POA: Diagnosis not present

## 2016-05-25 DIAGNOSIS — I5032 Chronic diastolic (congestive) heart failure: Secondary | ICD-10-CM

## 2016-05-25 NOTE — Progress Notes (Signed)
Daily Session Note  Patient Details  Name: Victor [name] MRN: [id number] Date of Birth: [birth year] Referring Provider:   Flowsheet Row Cardiac Rehab from 04/28/2016 in [facility] Cardiac and Pulmonary Rehab  Referring Provider  [facility]      Encounter Date: 05/25/2016  Check In:     Session Check In - 05/25/16 1640      Check-In   Location [facility]-Cardiac & Pulmonary Rehab   Staff Present Gerlene Burdock, RN, BSN;Susanne Bice, RN, BSN, Laveda Norman, BS, ACSM CEP, Exercise Physiologist   Supervising physician immediately available to respond to emergencies See telemetry face sheet for immediately available ER MD   Medication changes reported     No   Fall or balance concerns reported    No   Warm-up and Cool-down Performed on first and last piece of equipment   Resistance Training Performed Yes   VAD Patient? No     Pain Assessment   Currently in Pain? No/denies   Multiple Pain Sites No         Goals Met:  Independence with exercise equipment Exercise tolerated well No report of cardiac concerns or symptoms Strength training completed today  Goals Unmet:  Not Applicable  Comments: Pt able to follow exercise prescription today without complaint.  Will continue to monitor for progression.    Dr. Emily Filbert is Medical Director for [place] and [facility].

## 2016-05-27 ENCOUNTER — Encounter: Payer: Non-veteran care | Admitting: *Deleted

## 2016-05-27 DIAGNOSIS — I5042 Chronic combined systolic (congestive) and diastolic (congestive) heart failure: Secondary | ICD-10-CM | POA: Diagnosis not present

## 2016-05-27 DIAGNOSIS — I5032 Chronic diastolic (congestive) heart failure: Secondary | ICD-10-CM

## 2016-05-27 NOTE — Progress Notes (Signed)
Daily Session Note  Patient Details  Name: LADD CEN MRN: [id number] Date of Birth: [birth year] Referring Provider:   Flowsheet Row Cardiac Rehab from 04/28/2016 in [facility] Cardiac and Pulmonary Rehab  Referring Provider  [facility]      Encounter Date: 05/27/2016  Check In:     Session Check In - 05/27/16 1716      Check-In   Location [facility]-Cardiac & Pulmonary Rehab   Staff Present Nyoka Cowden, RN, BSN, MA;Carroll Enterkin, RN, Vickki Hearing, BA, ACSM CEP, Exercise Physiologist   Supervising physician immediately available to respond to emergencies See telemetry face sheet for immediately available ER MD   Medication changes reported     No   Fall or balance concerns reported    No   Warm-up and Cool-down Performed on first and last piece of equipment   Resistance Training Performed Yes   VAD Patient? No     Pain Assessment   Currently in Pain? No/denies         Goals Met:  Independence with exercise equipment Exercise tolerated well No report of cardiac concerns or symptoms Strength training completed today  Goals Unmet:  Not Applicable  Comments: Pt able to follow exercise prescription today without complaint.  Will continue to monitor for progression.   Dr. Emily Filbert is Medical Director for [place] and [facility].

## 2016-06-01 ENCOUNTER — Encounter: Payer: Non-veteran care | Admitting: *Deleted

## 2016-06-01 DIAGNOSIS — I5042 Chronic combined systolic (congestive) and diastolic (congestive) heart failure: Secondary | ICD-10-CM | POA: Diagnosis not present

## 2016-06-01 DIAGNOSIS — I5032 Chronic diastolic (congestive) heart failure: Secondary | ICD-10-CM

## 2016-06-01 NOTE — Progress Notes (Signed)
Daily Session Note  Patient Details  Name: JASTIN FORE MRN: [id number] Date of Birth: [birth year] Referring Provider:   Flowsheet Row Cardiac Rehab from 04/28/2016 in [facility] Cardiac and Pulmonary Rehab  Referring Provider  [facility]      Encounter Date: 06/01/2016  Check In:     Session Check In - 06/01/16 1635      Check-In   Staff Present Heath Lark, RN, BSN, CCRP;Carroll Enterkin, RN, Moises Blood, BS, ACSM CEP, Exercise Physiologist   Supervising physician immediately available to respond to emergencies See telemetry face sheet for immediately available ER MD   Medication changes reported     No   Fall or balance concerns reported    No   Warm-up and Cool-down Performed on first and last piece of equipment   Resistance Training Performed Yes   VAD Patient? No     Pain Assessment   Currently in Pain? No/denies         Goals Met:  Independence with exercise equipment Exercise tolerated well Personal goals reviewed No report of cardiac concerns or symptoms Strength training completed today  Goals Unmet:  Not Applicable  Comments: Doing well with exercise prescription progression.    Dr. Emily Filbert is Medical Director for [place] and [facility].

## 2016-06-03 ENCOUNTER — Encounter: Payer: Non-veteran care | Admitting: *Deleted

## 2016-06-03 DIAGNOSIS — I5042 Chronic combined systolic (congestive) and diastolic (congestive) heart failure: Secondary | ICD-10-CM | POA: Diagnosis not present

## 2016-06-03 DIAGNOSIS — I5032 Chronic diastolic (congestive) heart failure: Secondary | ICD-10-CM

## 2016-06-03 NOTE — Progress Notes (Signed)
Daily Session Note  Patient Details  Name: Victor [name] MRN: [id number] Date of Birth: [birth year] Referring Provider:   Flowsheet Row Cardiac Rehab from 04/28/2016 in [facility] Cardiac and Pulmonary Rehab  Referring Provider  [facility]      Encounter Date: 06/03/2016  Check In:     Session Check In - 06/03/16 1732      Check-In   Staff Present Heath Lark, RN, BSN, CCRP;Carroll Enterkin, RN, Vickki Hearing, BA, ACSM CEP, Exercise Physiologist   Supervising physician immediately available to respond to emergencies See telemetry face sheet for immediately available ER MD   Medication changes reported     No   Fall or balance concerns reported    No   Warm-up and Cool-down Performed on first and last piece of equipment   Resistance Training Performed Yes   VAD Patient? No     Pain Assessment   Currently in Pain? No/denies         Goals Met:  Exercise tolerated well Personal goals reviewed No report of cardiac concerns or symptoms Strength training completed today  Goals Unmet:  Not Applicable  Comments: Doing well with exercise prescription progression. Home exercise discussed with Victor [name] today. HR RPE and outdoor exercise Precautions reviewed and Victor [name] stated understanding.  Dr. Emily Filbert is Medical Director for [place] and [facility].

## 2016-06-04 DIAGNOSIS — I5042 Chronic combined systolic (congestive) and diastolic (congestive) heart failure: Secondary | ICD-10-CM | POA: Diagnosis not present

## 2016-06-04 DIAGNOSIS — I5032 Chronic diastolic (congestive) heart failure: Secondary | ICD-10-CM

## 2016-06-04 NOTE — Progress Notes (Signed)
Daily Session Note  Patient Details  Name: Victor [name] MRN: [id number] Date of Birth: 12-06-1945 Referring Provider:   Flowsheet Row Cardiac Rehab from 04/28/2016 in [facility] Cardiac and Pulmonary Rehab  Referring Provider  [facility]      Encounter Date: 06/04/2016  Check In:     Session Check In - 06/04/16 1735      Check-In   Location [facility]-Cardiac & Pulmonary Rehab   Staff Present Heath Lark, RN, BSN, CCRP;Laureen Owens Shark, BS, RRT, Respiratory Dareen Piano, BA, ACSM CEP, Exercise Physiologist   Supervising physician immediately available to respond to emergencies See telemetry face sheet for immediately available ER MD   Medication changes reported     No   Fall or balance concerns reported    No   Warm-up and Cool-down Performed on first and last piece of equipment   Resistance Training Performed Yes   VAD Patient? No     Pain Assessment   Currently in Pain? No/denies   Multiple Pain Sites No         Goals Met:  Independence with exercise equipment Exercise tolerated well No report of cardiac concerns or symptoms Strength training completed today  Goals Unmet:  Not Applicable  Comments: Pt able to follow exercise prescription today without complaint.  Will continue to monitor for progression.    Dr. Emily Filbert is Medical Director for [place] and [facility].

## 2016-06-08 ENCOUNTER — Encounter: Payer: Non-veteran care | Attending: Family

## 2016-06-08 DIAGNOSIS — E669 Obesity, unspecified: Secondary | ICD-10-CM | POA: Diagnosis not present

## 2016-06-08 DIAGNOSIS — Z87891 Personal history of nicotine dependence: Secondary | ICD-10-CM | POA: Diagnosis not present

## 2016-06-08 DIAGNOSIS — Z794 Long term (current) use of insulin: Secondary | ICD-10-CM | POA: Insufficient documentation

## 2016-06-08 DIAGNOSIS — E785 Hyperlipidemia, unspecified: Secondary | ICD-10-CM | POA: Insufficient documentation

## 2016-06-08 DIAGNOSIS — I4891 Unspecified atrial fibrillation: Secondary | ICD-10-CM | POA: Diagnosis not present

## 2016-06-08 DIAGNOSIS — I5032 Chronic diastolic (congestive) heart failure: Secondary | ICD-10-CM | POA: Diagnosis present

## 2016-06-08 DIAGNOSIS — I10 Essential (primary) hypertension: Secondary | ICD-10-CM | POA: Diagnosis not present

## 2016-06-08 DIAGNOSIS — I5042 Chronic combined systolic (congestive) and diastolic (congestive) heart failure: Secondary | ICD-10-CM | POA: Diagnosis not present

## 2016-06-08 DIAGNOSIS — R0602 Shortness of breath: Secondary | ICD-10-CM | POA: Insufficient documentation

## 2016-06-08 DIAGNOSIS — E119 Type 2 diabetes mellitus without complications: Secondary | ICD-10-CM | POA: Insufficient documentation

## 2016-06-08 NOTE — Progress Notes (Signed)
Daily Session Note  Patient Details  Name: Victor [name] MRN: [id number] Date of Birth: June 30, 1946 Referring Provider:   Flowsheet Row Cardiac Rehab from 04/28/2016 in [facility] Cardiac and Pulmonary Rehab  Referring Provider  [facility]      Encounter Date: 06/08/2016  Check In:     Session Check In - 06/08/16 1717      Check-In   Location [facility]-Cardiac & Pulmonary Rehab   Staff Present Heath Lark, RN, BSN, Laveda Norman, BS, ACSM CEP, Exercise Physiologist;Denver Harder Oletta Darter, [place], ACSM CEP, Exercise Physiologist   Supervising physician immediately available to respond to emergencies See telemetry face sheet for immediately available ER MD   Medication changes reported     No   Fall or balance concerns reported    No   Warm-up and Cool-down Performed on first and last piece of equipment   Resistance Training Performed Yes   VAD Patient? No     Pain Assessment   Currently in Pain? No/denies   Multiple Pain Sites No         Goals Met:  Independence with exercise equipment Exercise tolerated well No report of cardiac concerns or symptoms Strength training completed today  Goals Unmet:  Not Applicable  Comments: Pt able to follow exercise prescription today without complaint.  Will continue to monitor for progression.    Dr. Emily Filbert is Medical Director for [place] and [facility].

## 2016-06-10 ENCOUNTER — Encounter: Payer: Non-veteran care | Admitting: *Deleted

## 2016-06-10 DIAGNOSIS — I5032 Chronic diastolic (congestive) heart failure: Secondary | ICD-10-CM

## 2016-06-10 DIAGNOSIS — I5042 Chronic combined systolic (congestive) and diastolic (congestive) heart failure: Secondary | ICD-10-CM | POA: Diagnosis not present

## 2016-06-10 NOTE — Progress Notes (Signed)
Daily Session Note  Patient Details  Name: Victor [name] MRN: [id number] Date of Birth: July 25, 1946 Referring Provider:   Flowsheet Row Cardiac Rehab from 04/28/2016 in [facility] Cardiac and Pulmonary Rehab  Referring Provider  [facility]      Encounter Date: 06/10/2016  Check In:     Session Check In - 06/10/16 1621      Check-In   Location [facility]-Cardiac & Pulmonary Rehab   Staff Present Alberteen Sam, MA, ACSM RCEP, Exercise Physiologist;Amanda Oletta Darter, BA, ACSM CEP, Exercise Physiologist;Carroll Enterkin, RN, BSN   Supervising physician immediately available to respond to emergencies See telemetry face sheet for immediately available ER MD   Medication changes reported     No   Fall or balance concerns reported    No   Warm-up and Cool-down Performed on first and last piece of equipment   Resistance Training Performed Yes   VAD Patient? No     Pain Assessment   Currently in Pain? No/denies   Multiple Pain Sites No         Goals Met:  Independence with exercise equipment Exercise tolerated well No report of cardiac concerns or symptoms Strength training completed today  Goals Unmet:  Not Applicable  Comments: Pt able to follow exercise prescription today without complaint.  Will continue to monitor for progression.    Dr. Emily Filbert is Medical Director for [place] and [facility].

## 2016-06-11 ENCOUNTER — Encounter: Payer: Non-veteran care | Admitting: *Deleted

## 2016-06-11 DIAGNOSIS — I5032 Chronic diastolic (congestive) heart failure: Secondary | ICD-10-CM

## 2016-06-11 DIAGNOSIS — I5042 Chronic combined systolic (congestive) and diastolic (congestive) heart failure: Secondary | ICD-10-CM | POA: Diagnosis not present

## 2016-06-11 NOTE — Progress Notes (Signed)
Daily Session Note  Patient Details  Name: Victor [name] MRN: [id number] Date of Birth: 05-09-1946 Referring Provider:   Flowsheet Row Cardiac Rehab from 04/28/2016 in [facility] Cardiac and Pulmonary Rehab  Referring Provider  [facility]      Encounter Date: 06/11/2016  Check In:     Session Check In - 06/11/16 1741      Check-In   Location [facility]-Cardiac & Pulmonary Rehab   Staff Present Alberteen Sam, MA, ACSM RCEP, Exercise Physiologist;Amanda Oletta Darter, BA, ACSM CEP, Exercise Physiologist;Carroll Enterkin, RN, BSN   Supervising physician immediately available to respond to emergencies See telemetry face sheet for immediately available ER MD   Medication changes reported     No   Fall or balance concerns reported    No   Warm-up and Cool-down Performed on first and last piece of equipment   Resistance Training Performed Yes   VAD Patient? No     Pain Assessment   Currently in Pain? No/denies   Multiple Pain Sites No           Exercise Prescription Changes - 06/11/16 1200      Exercise Review   Progression Yes     Response to Exercise   Blood Pressure (Admit) 124/66   Blood Pressure (Exercise) 142/84   Blood Pressure (Exit) 126/60   Heart Rate (Admit) 84 bpm   Heart Rate (Exercise) 90 bpm   Heart Rate (Exit) 86 bpm   Rating of Perceived Exertion (Exercise) 12   Symptoms none   Duration Progress to 45 minutes of aerobic exercise without signs/symptoms of physical distress   Intensity THRR unchanged     Progression   Progression Continue to progress workloads to maintain intensity without signs/symptoms of physical distress.   Average METs 2.15     Resistance Training   Training Prescription Yes   Weight 4   Reps 10-15     Treadmill   MPH 1.5   Grade 0   Minutes 15   METs 2.15     NuStep   Level 4   Minutes 15      Goals Met:  Independence with exercise equipment Exercise tolerated well No report of cardiac concerns or symptoms Strength  training completed today  Goals Unmet:  Not Applicable  Comments: Pt able to follow exercise prescription today without complaint.  Will continue to monitor for progression.    Dr. Emily Filbert is Medical Director for [place] and [facility].

## 2016-06-15 ENCOUNTER — Encounter: Payer: Non-veteran care | Admitting: *Deleted

## 2016-06-15 DIAGNOSIS — I5042 Chronic combined systolic (congestive) and diastolic (congestive) heart failure: Secondary | ICD-10-CM | POA: Diagnosis not present

## 2016-06-15 DIAGNOSIS — I5032 Chronic diastolic (congestive) heart failure: Secondary | ICD-10-CM

## 2016-06-15 NOTE — Progress Notes (Signed)
Daily Session Note  Patient Details  Name: Victor [name] MRN: [id number] Date of Birth: 1946-04-24 Referring Provider:   Flowsheet Row Cardiac Rehab from 04/28/2016 in [facility] Cardiac and Pulmonary Rehab  Referring Provider  [facility]      Encounter Date: 06/15/2016  Check In:     Session Check In - 06/15/16 [place]      Check-In   Staff Present Heath Lark, RN, BSN, CCRP;Carroll Enterkin, RN, Moises Blood, BS, ACSM CEP, Exercise Physiologist   Supervising physician immediately available to respond to emergencies See telemetry face sheet for immediately available ER MD   Medication changes reported     No   Fall or balance concerns reported    No   Warm-up and Cool-down Performed on first and last piece of equipment   Resistance Training Performed Yes   VAD Patient? No     Pain Assessment   Currently in Pain? No/denies         Goals Met:  Exercise tolerated well No report of cardiac concerns or symptoms Strength training completed today  Goals Unmet:  Not Applicable  Comments: Doing well with exercise prescription progression.    Dr. Emily Filbert is Medical Director for [place] and [facility].

## 2016-06-17 ENCOUNTER — Encounter: Payer: Non-veteran care | Admitting: *Deleted

## 2016-06-17 ENCOUNTER — Encounter: Payer: Self-pay | Admitting: *Deleted

## 2016-06-17 DIAGNOSIS — I5042 Chronic combined systolic (congestive) and diastolic (congestive) heart failure: Secondary | ICD-10-CM | POA: Diagnosis not present

## 2016-06-17 DIAGNOSIS — I5032 Chronic diastolic (congestive) heart failure: Secondary | ICD-10-CM

## 2016-06-17 NOTE — Progress Notes (Signed)
Daily Session Note  Patient Details  Name: Victor [name] MRN: [id number] Date of Birth: 06/03/1946 Referring Provider:   Flowsheet Row Cardiac Rehab from 04/28/2016 in [facility] Cardiac and Pulmonary Rehab  Referring Provider  [facility]      Encounter Date: 06/17/2016  Check In:     Session Check In - 06/17/16 1757      Check-In   Location [facility]-Cardiac & Pulmonary Rehab   Staff Present Mary Jo Abernethy, RN, BSN, MA;Susanne Bice, RN, BSN, CCRP;Carroll Enterkin, RN, BSN   Supervising physician immediately available to respond to emergencies See telemetry face sheet for immediately available ER MD   Medication changes reported     No   Fall or balance concerns reported    No   Warm-up and Cool-down Performed on first and last piece of equipment   Resistance Training Performed Yes   VAD Patient? No     Pain Assessment   Currently in Pain? No/denies         Goals Met:  Proper associated with RPD/PD & O2 Sat Exercise tolerated well  Goals Unmet:  Not Applicable  Comments:     Dr. Mark Miller is Medical Director for [facility] and [facility]. 

## 2016-06-17 NOTE — Progress Notes (Signed)
Cardiac Individual Treatment Plan  Patient Details  Name: Victor [name] MRN: [id number] Date of Birth: [birth year] Referring Provider:   Flowsheet Row Cardiac Rehab from 04/28/2016 in [facility] Cardiac and Pulmonary Rehab  Referring Provider  [facility]      Initial Encounter Date:  Branch from 04/28/2016 in [facility] Cardiac and Pulmonary Rehab  Date  04/28/16  Referring Provider  [facility]      Visit Diagnosis: Heart failure, diastolic, chronic ([facility])  Patient's Home Medications on Admission:  Current Outpatient Prescriptions:  .  allopurinol (ZYLOPRIM) 100 MG tablet, Take 200 mg by mouth daily., Disp: , Rfl:  .  amLODipine (NORVASC) 10 MG tablet, Take 10 mg by mouth daily., Disp: , Rfl:  .  ammonium lactate (AMLACTIN) 12 % cream, Apply 1 g topically as needed for dry skin. Reported on 10/24/2015, Disp: , Rfl:  .  apixaban (ELIQUIS) 5 MG TABS tablet, Take 5 mg by mouth 2 (two) times daily., Disp: , Rfl:  .  carvedilol (COREG) 25 MG tablet, Take 25 mg by mouth every 12 (twelve) hours., Disp: , Rfl:  .  diltiazem (CARDIZEM) 30 MG tablet, Take 1 tablet (30 mg total) by mouth 3 (three) times daily as needed (For HR > 120). (Patient not taking: Reported on 04/28/2016), Disp: 60 tablet, Rfl: 1 .  glipiZIDE (GLUCOTROL) 10 MG tablet, Take 20 mg by mouth 2 (two) times daily., Disp: , Rfl:  .  insulin glargine (LANTUS) 100 UNIT/ML injection, Inject 20 Units into the skin every evening. , Disp: , Rfl:  .  levETIRAcetam (KEPPRA) 750 MG tablet, Take 1 tablet (750 mg total) by mouth 2 (two) times daily., Disp: 60 tablet, Rfl: 0 .  lisinopril (PRINIVIL,ZESTRIL) 20 MG tablet, Take 10 mg by mouth daily., Disp: , Rfl:  .  magnesium oxide (MAG-OX) 400 MG tablet, Take 400 mg by mouth daily., Disp: , Rfl:  .  simvastatin (ZOCOR) 20 MG tablet, Take 10 mg by mouth at bedtime., Disp: , Rfl:   Past Medical History: Past Medical History:  Diagnosis Date  . Atrial fibrillation ([place])    . Chronic systolic CHF (congestive heart failure) ([place])   . Diabetes mellitus without complication ([place])   . Hyperlipemia   . Hypertension     Tobacco Use: History  Smoking Status  . Former Smoker  Smokeless Tobacco  . Not on file    Labs: Recent Review Flowsheet Data    There is no flowsheet data to display.       Exercise Target Goals:    Exercise Program Goal: Individual exercise prescription set with THRR, safety & activity barriers. Participant demonstrates ability to understand and report RPE using BORG scale, to self-measure pulse accurately, and to acknowledge the importance of the exercise prescription.  Exercise Prescription Goal: Starting with aerobic activity 30 plus minutes a day, 3 days per week for initial exercise prescription. Provide home exercise prescription and guidelines that participant acknowledges understanding prior to discharge.  Activity Barriers & Risk Stratification:     Activity Barriers & Cardiac Risk Stratification - 04/28/16 1446      Activity Barriers & Cardiac Risk Stratification   Activity Barriers Back Problems;Deconditioning;Muscular Weakness;Shortness of Breath;Balance Concerns;History of Falls;Other (comment);Joint Problems   Comments Wears build up shoe on right side, also has neuropathy, both effect his balance at times, also has hip pain occasionally   Cardiac Risk Stratification High      6 Minute Walk:  6 Minute Walk    Row Name 04/28/16 1442         6 Minute Walk   Phase Initial     Distance 700 feet     Walk Time 5.43 minutes     # of Rest Breaks 3  2 sec, 26 sec, 6 sec     MPH 1.46     METS 1.96     RPE 14     Perceived Dyspnea  5     VO2 Peak 6.85     Symptoms Yes (comment)     Comments tired, SOB, low back and hip pain     Resting HR 69 bpm     Resting BP 134/56     Max Ex. HR 90 bpm     Max Ex. BP 154/84     2 Minute Post BP 146/74        Initial Exercise Prescription:     Initial  Exercise Prescription - 04/28/16 1400      Date of Initial Exercise RX and Referring Provider   Date 04/28/16   Referring Provider [facility]     Treadmill   MPH 1   Grade 0   Minutes '15  5 5 5   '$ METs 1.77     NuStep   Level 1   Minutes 15   METs 1.7     Recumbant Elliptical   Level 1   RPM 40   Minutes 15   METs 1.7     Prescription Details   Frequency (times per week) 3   Duration Progress to 45 minutes of aerobic exercise without signs/symptoms of physical distress     Intensity   THRR 40-80% of Max Heartrate 102-135   Ratings of Perceived Exertion 11-15   Perceived Dyspnea 0-4     Progression   Progression Continue to progress workloads to maintain intensity without signs/symptoms of physical distress.     Resistance Training   Training Prescription Yes   Weight 2 lbs   Reps 10-15      Perform Capillary Blood Glucose checks as needed.  Exercise Prescription Changes:     Exercise Prescription Changes    Row Name 04/28/16 1400 05/14/16 1200 05/28/16 1100 06/03/16 1700 06/11/16 1200     Exercise Review   Progression  - Yes Yes  - Yes     Response to Exercise   Blood Pressure (Admit) 135/56 128/64 128/68  - 124/66   Blood Pressure (Exercise) 158/84 158/82 152/74  - 142/84   Blood Pressure (Exit) 146/74 128/64 128/62  - 126/60   Heart Rate (Admit) 69 bpm 80 bpm 88 bpm  - 84 bpm   Heart Rate (Exercise) 90 bpm 135 bpm 95 bpm  - 90 bpm   Heart Rate (Exit) 85 bpm 68 bpm 78 bpm  - 86 bpm   Oxygen Saturation (Exercise) 89 %  -  -  -  -   Rating of Perceived Exertion (Exercise) '15 13 11  '$ - 12   Perceived Dyspnea (Exercise) 5  -  -  -  -   Symptoms  - none none  - none   Duration  - Progress to 45 minutes of aerobic exercise without signs/symptoms of physical distress Progress to 45 minutes of aerobic exercise without signs/symptoms of physical distress  - Progress to 45 minutes of aerobic exercise without signs/symptoms of physical distress   Intensity   - THRR unchanged THRR unchanged  - THRR unchanged  Progression   Progression  -  - Continue to progress workloads to maintain intensity without signs/symptoms of physical distress.  - Continue to progress workloads to maintain intensity without signs/symptoms of physical distress.   Average METs  -  - 2.2  - 2.15     Resistance Training   Training Prescription  - Yes Yes  - Yes   Weight  - 2 2  - 4   Reps  - 10-15 10-15  - 10-15     Interval Training   Interval Training  - No No  -  -     Treadmill   MPH  - 1  -  - 1.5   Grade  - 0  -  - 0   Minutes  - 15  -  - 15   METs  - 1.77  -  - 2.15     NuStep   Level  - 1 1  - 4   Minutes  - 15 15  - 15   METs  - 1.7  -  -  -     Recumbant Elliptical   Level  -  - 3  -  -   Minutes  -  - 15  -  -     Home Exercise Plan   Plans to continue exercise at  -  -  - [employer]  -   Frequency  -  -  - Add 1 additional day to program exercise sessions.  -      Exercise Comments:     Exercise Comments    Row Name 04/28/16 1448 05/14/16 1205 05/28/16 1141 06/03/16 1739 06/11/16 1242   Exercise Comments Exercise goals include breathing better and able to be more active. Victor [name] is progressing well with exercise. Victor [name] has progressed well with exercise. Home exercise discussed with Victor [name] today. HR RPE and outdoor exercise Victor [name] is progressing well with exercise.      Discharge Exercise Prescription (Final Exercise Prescription Changes):     Exercise Prescription Changes - 06/11/16 1200      Exercise Review   Progression Yes     Response to Exercise   Blood Pressure (Admit) 124/66   Blood Pressure (Exercise) 142/84   Blood Pressure (Exit) 126/60   Heart Rate (Admit) 84 bpm   Heart Rate (Exercise) 90 bpm   Heart Rate (Exit) 86 bpm   Rating of Perceived Exertion (Exercise) 12   Symptoms none   Duration Progress to 45 minutes of aerobic exercise without signs/symptoms of physical distress   Intensity THRR unchanged      Progression   Progression Continue to progress workloads to maintain intensity without signs/symptoms of physical distress.   Average METs 2.15     Resistance Training   Training Prescription Yes   Weight 4   Reps 10-15     Treadmill   MPH 1.5   Grade 0   Minutes 15   METs 2.15     NuStep   Level 4   Minutes 15      Nutrition:  Target Goals: Understanding of nutrition guidelines, daily intake of sodium 1500mg , cholesterol 200mg , calories 30% from fat and 7% or less from saturated fats, daily to have 5 or more servings of fruits and vegetables.  Biometrics:     Pre Biometrics - 04/28/16 1445      Pre Biometrics   Height 6' 0.75" (1.848 m)   Weight 206 lb 8 oz (93.7 kg)  Waist Circumference 43.25 inches   Hip Circumference 41 inches   Waist to Hip Ratio 1.05 %   BMI (Calculated) 27.5   Single Leg Stand 4.41 seconds       Nutrition Therapy Plan and Nutrition Goals:     Nutrition Therapy & Goals - 04/28/16 1515      Intervention Plan   Intervention Prescribe, educate and counsel regarding individualized specific dietary modifications aiming towards targeted core components such as weight, hypertension, lipid management, diabetes, heart failure and other comorbidities.   Expected Outcomes Short Term Goal: Understand basic principles of dietary content, such as calories, fat, sodium, cholesterol and nutrients.      Nutrition Discharge: Rate Your Plate Scores:     Nutrition Assessments - 04/28/16 1515      Rate Your Plate Scores   Pre Score 68   Pre Score % 76 %      Nutrition Goals Re-Evaluation:     Nutrition Goals Re-Evaluation    Row Name 05/07/16 1805             Personal Goal #1 Re-Evaluation   Personal Goal #1 Heart healthy diet       Comments Victor [name] mentioned that he went to the nursing home about every day for 3 years when his wife was in the nursing home before she died. Victor [name] said he got used to feeding her lunch then returning home and  having his lunch at 2pm. Victor [name] has diabetes.          Psychosocial: Target Goals: Acknowledge presence or absence of depression, maximize coping skills, provide positive support system. Participant is able to verbalize types and ability to use techniques and skills needed for reducing stress and depression.  Initial Review & Psychosocial Screening:     Initial Psych Review & Screening - 05/07/16 1804      Family Dynamics   Comments Victor [name] mentioned that he went to the nursing home about every day for 3 years when his wife was in the nursing home before she died. Victor [name] said he got used to feeding her lunch then returning home and having his lunch at 2pm      Quality of Life Scores:     Quality of Life - 04/28/16 1517      Quality of Life Scores   Health/Function Pre 9.89 %   Socioeconomic Pre 24.29 %   Psych/Spiritual Pre 20.71 %   Family Pre 58.5 %   GLOBAL Pre 17.73 %      PHQ-9: Recent Review Flowsheet Data    Depression screen [facility] 2/9 04/28/2016   Decreased Interest 1   Down, Depressed, Hopeless 1   PHQ - 2 Score 2   Altered sleeping 1   Tired, decreased energy 1   Change in appetite 1   Feeling bad or failure about yourself  0   Trouble concentrating 0   Moving slowly or fidgety/restless 0   Suicidal thoughts 0   PHQ-9 Score 5   Difficult doing work/chores Somewhat difficult      Psychosocial Evaluation and Intervention:     Psychosocial Evaluation - 05/25/16 1720      Psychosocial Evaluation & Interventions   Comments Counselor met with Victor [name] today for initial psychosocial evaluation.  He is a 70 year old gentleman who has been in this program for several weeks now.  He reports already experiencing a great deal of progress with improved breathing; energy and mood since coming to Cardiac Rehab.  Victor [name]  reports his spouse passed away about a year ago and he has been in and out of the hospital since that time with strokes and other health problems.  He  states his doctors changed his diuretics just prior to his coming here and that has made a huge difference as he has lost 12 pounds and having less fluid helps him feel better overall.  Victor [name] is optimistic at this time that he will continue to improve during the remainder of this program.  Counselor will continue to follow with him.        Psychosocial Re-Evaluation:   Vocational Rehabilitation: Provide vocational rehab assistance to qualifying candidates.   Vocational Rehab Evaluation & Intervention:     Vocational Rehab - 04/28/16 1525      Initial Vocational Rehab Evaluation & Intervention   Assessment shows need for Vocational Rehabilitation No      Education: Education Goals: Education classes will be provided on a weekly basis, covering required topics. Participant will state understanding/return demonstration of topics presented.  Learning Barriers/Preferences:     Learning Barriers/Preferences - 04/28/16 1525      Learning Barriers/Preferences   Learning Barriers None   Learning Preferences None      Education Topics: General Nutrition Guidelines/Fats and Fiber: -Group instruction provided by verbal, written material, models and posters to present the general guidelines for heart healthy nutrition. Gives an explanation and review of dietary fats and fiber. Flowsheet Row Cardiac Rehab from 06/15/2016 in [facility] Cardiac and Pulmonary Rehab  Date  05/25/16  Educator  PI  Instruction Review Code  2- meets goals/outcomes      Controlling Sodium/Reading Food Labels: -Group verbal and written material supporting the discussion of sodium use in heart healthy nutrition. Review and explanation with models, verbal and written materials for utilization of the food label. Flowsheet Row Cardiac Rehab from 06/15/2016 in [facility] Cardiac and Pulmonary Rehab  Date  06/01/16  Educator  PI  Instruction Review Code  2- meets goals/outcomes      Exercise Physiology & Risk  Factors: - Group verbal and written instruction with models to review the exercise physiology of the cardiovascular system and associated critical values. Details cardiovascular disease risk factors and the goals associated with each risk factor. Flowsheet Row Cardiac Rehab from 06/15/2016 in [facility] Cardiac and Pulmonary Rehab  Date  06/08/16  Educator  [facility]  Instruction Review Code  2- meets goals/outcomes      Aerobic Exercise & Resistance Training: - Gives group verbal and written discussion on the health impact of inactivity. On the components of aerobic and resistive training programs and the benefits of this training and how to safely progress through these programs. Flowsheet Row Cardiac Rehab from 06/15/2016 in [facility] Cardiac and Pulmonary Rehab  Date  06/10/16  Educator  AS  Instruction Review Code  2- meets goals/outcomes      Flexibility, Balance, General Exercise Guidelines: - Provides group verbal and written instruction on the benefits of flexibility and balance training programs. Provides general exercise guidelines with specific guidelines to those with heart or lung disease. Demonstration and skill practice provided. Flowsheet Row Cardiac Rehab from 06/15/2016 in [facility] Cardiac and Pulmonary Rehab  Date  06/15/16  Educator  [facility]  Instruction Review Code  2- meets goals/outcomes      Stress Management: - Provides group verbal and written instruction about the health risks of elevated stress, cause of high stress, and healthy ways to reduce stress.   Depression: - Provides group verbal  and written instruction on the correlation between heart/lung disease and depressed mood, treatment options, and the stigmas associated with seeking treatment. Flowsheet Row Cardiac Rehab from 06/15/2016 in [facility] Cardiac and Pulmonary Rehab  Date  05/27/16  Educator  [facility]  Instruction Review Code  2- meets goals/outcomes      Anatomy & Physiology of the Heart: - Group verbal and written  instruction and models provide basic cardiac anatomy and physiology, with the coronary electrical and arterial systems. Review of: AMI, Angina, Valve disease, Heart Failure, Cardiac Arrhythmia, Pacemakers, and the ICD.   Cardiac Procedures: - Group verbal and written instruction and models to describe the testing methods done to diagnose heart disease. Reviews the outcomes of the test results. Describes the treatment choices: Medical Management, Angioplasty, or Coronary Bypass Surgery.   Cardiac Medications: - Group verbal and written instruction to review commonly prescribed medications for heart disease. Reviews the medication, class of the drug, and side effects. Includes the steps to properly store meds and maintain the prescription regimen. Flowsheet Row Cardiac Rehab from 06/15/2016 in [facility] Cardiac and Pulmonary Rehab  Date  05/13/16  Educator  SB  Instruction Review Code  2- meets goals/outcomes      Go Sex-Intimacy & Heart Disease, Get SMART - Goal Setting: - Group verbal and written instruction through game format to discuss heart disease and the return to sexual intimacy. Provides group verbal and written material to discuss and apply goal setting through the application of the S.M.A.R.T. Method.   Other Matters of the Heart: - Provides group verbal, written materials and models to describe Heart Failure, Angina, Valve Disease, and Diabetes in the realm of heart disease. Includes description of the disease process and treatment options available to the cardiac patient.   Exercise & Equipment Safety: - Individual verbal instruction and demonstration of equipment use and safety with use of the equipment. Flowsheet Row Cardiac Rehab from 06/15/2016 in [facility] Cardiac and Pulmonary Rehab  Date  04/28/16  Educator  SB  Instruction Review Code  2- meets goals/outcomes      Infection Prevention: - Provides verbal and written material to individual with discussion of infection control  including proper hand washing and proper equipment cleaning during exercise session. Flowsheet Row Cardiac Rehab from 06/15/2016 in [facility] Cardiac and Pulmonary Rehab  Date  04/28/16  Educator  SB  Instruction Review Code  2- meets goals/outcomes      Falls Prevention: - Provides verbal and written material to individual with discussion of falls prevention and safety.   Diabetes: - Individual verbal and written instruction to review signs/symptoms of diabetes, desired ranges of glucose level fasting, after meals and with exercise. Advice that pre and post exercise glucose checks will be done for 3 sessions at entry of program. Flowsheet Row Cardiac Rehab from 06/15/2016 in [facility] Cardiac and Pulmonary Rehab  Date  04/28/16  Educator  SB  Instruction Review Code  2- meets goals/outcomes       Knowledge Questionnaire Score:     Knowledge Questionnaire Score - 04/28/16 1525      Knowledge Questionnaire Score   Pre Score 24/28      Core Components/Risk Factors/Patient Goals at Admission:     Personal Goals and Risk Factors at Admission - 04/28/16 1330      Core Components/Risk Factors/Patient Goals on Admission    Weight Management Obesity;Weight Maintenance;Yes   Intervention Weight Management: Develop a combined nutrition and exercise program designed to reach desired caloric intake, while maintaining appropriate  intake of nutrient and fiber, sodium and fats, and appropriate energy expenditure required for the weight goal.;Weight Management: Provide education and appropriate resources to help participant work on and attain dietary goals.;Weight Management/Obesity: Establish reasonable short term and long term weight goals.;Obesity: Provide education and appropriate resources to help participant work on and attain dietary goals.   Admit Weight 202 lb (91.6 kg)   Goal Weight: Short Term 197 lb (89.4 kg)   Goal Weight: Long Term 180 lb (81.6 kg)   Expected Outcomes Short Term:  Continue to assess and modify interventions until short term weight is achieved;Long Term: Adherence to nutrition and physical activity/exercise program aimed toward attainment of established weight goal   Sedentary Yes   Intervention Provide advice, education, support and counseling about physical activity/exercise needs.;Develop an individualized exercise prescription for aerobic and resistive training based on initial evaluation findings, risk stratification, comorbidities and participant's personal goals.   Expected Outcomes Achievement of increased cardiorespiratory fitness and enhanced flexibility, muscular endurance and strength shown through measurements of functional capacity and personal statement of participant.   Increase Strength and Stamina Yes   Intervention Provide advice, education, support and counseling about physical activity/exercise needs.;Develop an individualized exercise prescription for aerobic and resistive training based on initial evaluation findings, risk stratification, comorbidities and participant's personal goals.   Expected Outcomes Achievement of increased cardiorespiratory fitness and enhanced flexibility, muscular endurance and strength shown through measurements of functional capacity and personal statement of participant.   Improve shortness of breath with ADL's Yes   Intervention Provide education, individualized exercise plan and daily activity instruction to help decrease symptoms of SOB with activities of daily living.   Expected Outcomes Short Term: Achieves a reduction of symptoms when performing activities of daily living.   Diabetes Yes  1990 diagnosed.  Insulin started one year ago.    Intervention Provide education about signs/symptoms and action to take for hypo/hyperglycemia.;Provide education about proper nutrition, including hydration, and aerobic/resistive exercise prescription along with prescribed medications to achieve blood glucose in normal  ranges: Fasting glucose 65-99 mg/dL   Expected Outcomes Short Term: Participant verbalizes understanding of the signs/symptoms and immediate care of hyper/hypoglycemia, proper foot care and importance of medication, aerobic/resistive exercise and nutrition plan for blood glucose control.;Long Term: Attainment of HbA1C < 7%.   Heart Failure Yes   Intervention Provide a combined exercise and nutrition program that is supplemented with education, support and counseling about heart failure. Directed toward relieving symptoms such as shortness of breath, decreased exercise tolerance, and extremity edema.   Expected Outcomes Improve functional capacity of life   Hypertension Yes   Intervention Provide education on lifestyle modifcations including regular physical activity/exercise, weight management, moderate sodium restriction and increased consumption of fresh fruit, vegetables, and low fat dairy, alcohol moderation, and smoking cessation.;Monitor prescription use compliance.   Expected Outcomes Short Term: Continued assessment and intervention until BP is < 140/55m HG in hypertensive participants. < 130/838mHG in hypertensive participants with diabetes, heart failure or chronic kidney disease.;Long Term: Maintenance of blood pressure at goal levels.   Lipids Yes   Intervention Provide education and support for participant on nutrition & aerobic/resistive exercise along with prescribed medications to achieve LDL '70mg'$ , HDL >'40mg'$ .   Expected Outcomes Short Term: Participant states understanding of desired cholesterol values and is compliant with medications prescribed. Participant is following exercise prescription and nutrition guidelines.;Long Term: Cholesterol controlled with medications as prescribed, with individualized exercise RX and with personalized nutrition plan. Value goals: LDL < '70mg'$ , HDL >  40 mg.      Core Components/Risk Factors/Patient Goals Review:      Goals and Risk Factor Review     Row Name 05/18/16 1802 06/01/16 1744           Core Components/Risk Factors/Patient Goals Review   Personal Goals Review Weight Management/Obesity;Diabetes;Lipids;Heart Failure Weight Management/Obesity;Improve shortness of breath with ADL's      Review Spoke with Merel today about his goals.   Gerome has seen weight drop seveal poundds and is not seeing fluid retention of 2-3 pound flucuation since has changed diurectic. Diabetes is staying well controlled along with cholesterol and heart failure symptoms Karron reports he is down to 192 lbs with his clothes on. Has met his short term goal for weight management.  Shortness of breath symptoms are improvng with fluid loss and exercise routine      Expected Outcomes Continue with weight loss following exercise and nutrition plans. Maintian control of heart failure symptoms, cholesterol levels, through prescribed meds. exercise and nutrition plans. Continue with weight loss following exercise and nutrition . Continued maintainence of fluid levels to aid in decreasing SOB         Core Components/Risk Factors/Patient Goals at Discharge (Final Review):      Goals and Risk Factor Review - 06/01/16 1744      Core Components/Risk Factors/Patient Goals Review   Personal Goals Review Weight Management/Obesity;Improve shortness of breath with ADL's   Review Maurilio reports he is down to 192 lbs with his clothes on. Has met his short term goal for weight management.  Shortness of breath symptoms are improvng with fluid loss and exercise routine   Expected Outcomes Continue with weight loss following exercise and nutrition . Continued maintainence of fluid levels to aid in decreasing SOB      ITP Comments:     ITP Comments    Row Name 04/28/16 1509 05/14/16 1205 05/20/16 0716 06/03/16 1739 06/17/16 1112   ITP Comments Initial ITP created today during medical review.  Diagnosis documentation notes sent for scan into [facility]. Amr is progressing well with  exercise. 30 day review. Continue with ITP unless changes noted by Medical Director at signature of review. Home exercise discussed with Sonia Side today. HR RPE and outdoor exercise 30 day review. Continue with ITP unless changes noted by Medical Director at signature of review.      Comments:

## 2016-06-18 DIAGNOSIS — I5032 Chronic diastolic (congestive) heart failure: Secondary | ICD-10-CM

## 2016-06-18 DIAGNOSIS — I5042 Chronic combined systolic (congestive) and diastolic (congestive) heart failure: Secondary | ICD-10-CM | POA: Diagnosis not present

## 2016-06-18 NOTE — Progress Notes (Signed)
Daily Session Note  Patient Details  Name: Victor [name] MRN: [id number] Date of Birth: [birth year] Referring Provider:   Flowsheet Row Cardiac Rehab from 04/28/2016 in [facility] Cardiac and Pulmonary Rehab  Referring Provider  [facility]      Encounter Date: 06/18/2016  Check In:     Session Check In - 06/18/16 1701      Check-In   Location [facility]-Cardiac & Pulmonary Rehab   Staff Present Jeanell Sparrow, DPT, Burlene Arnt, BA, ACSM CEP, Exercise Physiologist;Other   Supervising physician immediately available to respond to emergencies See telemetry face sheet for immediately available ER MD   Medication changes reported     No   Fall or balance concerns reported    No   Warm-up and Cool-down Performed on first and last piece of equipment   Resistance Training Performed Yes   VAD Patient? No     Pain Assessment   Currently in Pain? No/denies         Goals Met:  Independence with exercise equipment  Goals Unmet:  Not Applicable  Comments: Patient completed exercise prescription and all exercise goals during rehab session. The exercise was tolerated well and the patient is progressing in the program.    Dr. Emily Filbert is Medical Director for [place] and [facility].

## 2016-06-22 ENCOUNTER — Encounter: Payer: Self-pay | Admitting: *Deleted

## 2016-06-22 ENCOUNTER — Encounter: Payer: Non-veteran care | Admitting: *Deleted

## 2016-06-22 DIAGNOSIS — I5042 Chronic combined systolic (congestive) and diastolic (congestive) heart failure: Secondary | ICD-10-CM | POA: Diagnosis not present

## 2016-06-22 DIAGNOSIS — I5032 Chronic diastolic (congestive) heart failure: Secondary | ICD-10-CM

## 2016-06-22 NOTE — Progress Notes (Signed)
Daily Session Note  Patient Details  Name: Victor [name] MRN: [id number] Date of Birth: Jul 11, 1946 Referring Provider:   Flowsheet Row Cardiac Rehab from 04/28/2016 in [facility] Cardiac and Pulmonary Rehab  Referring Provider  [facility]      Encounter Date: 06/22/2016  Check In:     Session Check In - 06/22/16 1629      Check-In   Location [facility]-Cardiac & Pulmonary Rehab   Staff Present Gerlene Burdock, RN, BSN;Susanne Bice, RN, BSN, Laveda Norman, BS, ACSM CEP, Exercise Physiologist   Supervising physician immediately available to respond to emergencies See telemetry face sheet for immediately available ER MD   Medication changes reported     No   Fall or balance concerns reported    No   Warm-up and Cool-down Performed on first and last piece of equipment   Resistance Training Performed Yes   VAD Patient? No     Pain Assessment   Currently in Pain? No/denies   Multiple Pain Sites No         Goals Met:  Independence with exercise equipment Exercise tolerated well No report of cardiac concerns or symptoms Strength training completed today  Goals Unmet:  Not Applicable  Comments: Pt able to follow exercise prescription today without complaint.  Will continue to monitor for progression.    Dr. Emily Filbert is Medical Director for [place] and [facility].

## 2016-06-24 ENCOUNTER — Encounter: Payer: Non-veteran care | Admitting: *Deleted

## 2016-06-24 DIAGNOSIS — I5042 Chronic combined systolic (congestive) and diastolic (congestive) heart failure: Secondary | ICD-10-CM | POA: Diagnosis not present

## 2016-06-24 DIAGNOSIS — I5032 Chronic diastolic (congestive) heart failure: Secondary | ICD-10-CM

## 2016-06-24 NOTE — Progress Notes (Signed)
Daily Session Note  Patient Details  Name: Victor [name] MRN: [id number] Date of Birth: Apr 22, 1946 Referring Provider:   Flowsheet Row Cardiac Rehab from 04/28/2016 in [facility] Cardiac and Pulmonary Rehab  Referring Provider  [facility]      Encounter Date: 06/24/2016  Check In:     Session Check In - 06/24/16 1630      Check-In   Staff Present Heath Lark, RN, BSN, CCRP;Carroll Enterkin, RN, Vickki Hearing, BA, ACSM CEP, Exercise Physiologist   Supervising physician immediately available to respond to emergencies See telemetry face sheet for immediately available ER MD   Medication changes reported     No   Fall or balance concerns reported    No   Warm-up and Cool-down Performed on first and last piece of equipment   Resistance Training Performed Yes   VAD Patient? No     Pain Assessment   Currently in Pain? No/denies         Goals Met:  Independence with exercise equipment Exercise tolerated well No report of cardiac concerns or symptoms  Goals Unmet:  Not Applicable  Comments: Doing well with exercise prescription progression.    Dr. Emily Filbert is Medical Director for [place] and [facility].

## 2016-07-01 DIAGNOSIS — I5032 Chronic diastolic (congestive) heart failure: Secondary | ICD-10-CM

## 2016-07-01 DIAGNOSIS — I5042 Chronic combined systolic (congestive) and diastolic (congestive) heart failure: Secondary | ICD-10-CM | POA: Diagnosis not present

## 2016-07-01 NOTE — Progress Notes (Signed)
Daily Session Note  Patient Details  Name: Victor [name] MRN: [id number] Date of Birth: 1946/06/08 Referring Provider:   Flowsheet Row Cardiac Rehab from 04/28/2016 in [facility] Cardiac and Pulmonary Rehab  Referring Provider  [facility]      Encounter Date: 07/01/2016  Check In:     Session Check In - 07/01/16 1753      Check-In   Location [facility]-Cardiac & Pulmonary Rehab   Staff Present Heath Lark, RN, BSN, CCRP;Carroll Enterkin, RN, Vickki Hearing, BA, ACSM CEP, Exercise Physiologist   Supervising physician immediately available to respond to emergencies See telemetry face sheet for immediately available ER MD   Medication changes reported     No   Fall or balance concerns reported    No   Warm-up and Cool-down Performed on first and last piece of equipment   Resistance Training Performed Yes   VAD Patient? No     Pain Assessment   Currently in Pain? No/denies         Goals Met:  Independence with exercise equipment Exercise tolerated well No report of cardiac concerns or symptoms Strength training completed today  Goals Unmet:  Not Applicable  Comments: Pt able to follow exercise prescription today without complaint.  Will continue to monitor for progression.    Dr. Emily Filbert is Medical Director for [place] and [facility].

## 2016-07-02 ENCOUNTER — Encounter: Payer: Non-veteran care | Admitting: *Deleted

## 2016-07-02 DIAGNOSIS — I5032 Chronic diastolic (congestive) heart failure: Secondary | ICD-10-CM

## 2016-07-02 DIAGNOSIS — I5042 Chronic combined systolic (congestive) and diastolic (congestive) heart failure: Secondary | ICD-10-CM | POA: Diagnosis not present

## 2016-07-02 NOTE — Progress Notes (Signed)
Daily Session Note  Patient Details  Name: BODIE ABERNETHY MRN: [id number] Date of Birth: 02-05-1946 Referring Provider:   Flowsheet Row Cardiac Rehab from 04/28/2016 in [facility] Cardiac and Pulmonary Rehab  Referring Provider  [facility]      Encounter Date: 07/02/2016  Check In:     Session Check In - 07/02/16 1557      Check-In   Location [facility]-Cardiac & Pulmonary Rehab   Staff Present Gerlene Burdock, RN, Moises Blood, BS, ACSM CEP, Exercise Physiologist   Supervising physician immediately available to respond to emergencies See telemetry face sheet for immediately available ER MD   Medication changes reported     No   Fall or balance concerns reported    No   Warm-up and Cool-down Performed on first and last piece of equipment   Resistance Training Performed Yes   VAD Patient? No     Pain Assessment   Currently in Pain? No/denies         Goals Met:  Proper associated with RPD/PD & O2 Sat  Goals Unmet:  Not Applicable  Comments:     Dr. Emily Filbert is Medical Director for [place] and [facility].

## 2016-07-03 NOTE — Progress Notes (Signed)
Cardiac Individual Treatment Plan  Patient Details  Name: Victor [name] MRN: [id number] Date of Birth: Jul 14, 1946 Referring Provider:   Flowsheet Row Cardiac Rehab from 04/28/2016 in [facility] Cardiac and Pulmonary Rehab  Referring Provider  [facility]      Initial Encounter Date:  [place] from 04/28/2016 in [facility] Cardiac and Pulmonary Rehab  Date  04/28/16  Referring Provider  [facility]      Visit Diagnosis: Heart failure, diastolic, chronic ([place])  Patient's Home Medications on Admission:  Current Outpatient Prescriptions:  .  allopurinol (ZYLOPRIM) 100 MG tablet, Take 200 mg by mouth daily., Disp: , Rfl:  .  amLODipine (NORVASC) 10 MG tablet, Take 10 mg by mouth daily., Disp: , Rfl:  .  ammonium lactate (AMLACTIN) 12 % cream, Apply 1 g topically as needed for dry skin. Reported on 10/24/2015, Disp: , Rfl:  .  apixaban (ELIQUIS) 5 MG TABS tablet, Take 5 mg by mouth 2 (two) times daily., Disp: , Rfl:  .  carvedilol (COREG) 25 MG tablet, Take 25 mg by mouth every 12 (twelve) hours., Disp: , Rfl:  .  diltiazem (CARDIZEM) 30 MG tablet, Take 1 tablet (30 mg total) by mouth 3 (three) times daily as needed (For HR > 120). (Patient not taking: Reported on 04/28/2016), Disp: 60 tablet, Rfl: 1 .  glipiZIDE (GLUCOTROL) 10 MG tablet, Take 20 mg by mouth 2 (two) times daily., Disp: , Rfl:  .  insulin glargine (LANTUS) 100 UNIT/ML injection, Inject 20 Units into the skin every evening. , Disp: , Rfl:  .  levETIRAcetam (KEPPRA) 750 MG tablet, Take 1 tablet (750 mg total) by mouth 2 (two) times daily., Disp: 60 tablet, Rfl: 0 .  lisinopril (PRINIVIL,ZESTRIL) 20 MG tablet, Take 10 mg by mouth daily., Disp: , Rfl:  .  magnesium oxide (MAG-OX) 400 MG tablet, Take 400 mg by mouth daily., Disp: , Rfl:  .  simvastatin (ZOCOR) 20 MG tablet, Take 10 mg by mouth at bedtime., Disp: , Rfl:   Past Medical History: Past Medical History:  Diagnosis Date  . Atrial fibrillation ([place])    . Chronic systolic CHF (congestive heart failure) ([place])   . Diabetes mellitus without complication ([place])   . Hyperlipemia   . Hypertension     Tobacco Use: History  Smoking Status  . Former Smoker  Smokeless Tobacco  . Not on file    Labs: Recent Review Flowsheet Data    There is no flowsheet data to display.       Exercise Target Goals:    Exercise Program Goal: Individual exercise prescription set with THRR, safety & activity barriers. Participant demonstrates ability to understand and report RPE using BORG scale, to self-measure pulse accurately, and to acknowledge the importance of the exercise prescription.  Exercise Prescription Goal: Starting with aerobic activity 30 plus minutes a day, 3 days per week for initial exercise prescription. Provide home exercise prescription and guidelines that participant acknowledges understanding prior to discharge.  Activity Barriers & Risk Stratification:     Activity Barriers & Cardiac Risk Stratification - 04/28/16 1446      Activity Barriers & Cardiac Risk Stratification   Activity Barriers Back Problems;Deconditioning;Muscular Weakness;Shortness of Breath;Balance Concerns;History of Falls;Other (comment);Joint Problems   Comments Wears build up shoe on right side, also has neuropathy, both effect his balance at times, also has hip pain occasionally   Cardiac Risk Stratification High      6 Minute Walk:  6 Minute Walk    Row Name 04/28/16 1442         6 Minute Walk   Phase Initial     Distance 700 feet     Walk Time 5.43 minutes     # of Rest Breaks 3  2 sec, 26 sec, 6 sec     MPH 1.46     METS 1.96     RPE 14     Perceived Dyspnea  5     VO2 Peak 6.85     Symptoms Yes (comment)     Comments tired, SOB, low back and hip pain     Resting HR 69 bpm     Resting BP 134/56     Max Ex. HR 90 bpm     Max Ex. BP 154/84     2 Minute Post BP 146/74        Initial Exercise Prescription:     Initial  Exercise Prescription - 04/28/16 1400      Date of Initial Exercise RX and Referring Provider   Date 04/28/16   Referring Provider [facility]     Treadmill   MPH 1   Grade 0   Minutes '15  5 5 5   '$ METs 1.77     NuStep   Level 1   Minutes 15   METs 1.7     Recumbant Elliptical   Level 1   RPM 40   Minutes 15   METs 1.7     Prescription Details   Frequency (times per week) 3   Duration Progress to 45 minutes of aerobic exercise without signs/symptoms of physical distress     Intensity   THRR 40-80% of Max Heartrate 102-135   Ratings of Perceived Exertion 11-15   Perceived Dyspnea 0-4     Progression   Progression Continue to progress workloads to maintain intensity without signs/symptoms of physical distress.     Resistance Training   Training Prescription Yes   Weight 2 lbs   Reps 10-15      Perform Capillary Blood Glucose checks as needed.  Exercise Prescription Changes:     Exercise Prescription Changes    Row Name 04/28/16 1400 05/14/16 1200 05/28/16 1100 06/03/16 1700 06/11/16 1200     Exercise Review   Progression  - Yes Yes  - Yes     Response to Exercise   Blood Pressure (Admit) 135/56 128/64 128/68  - 124/66   Blood Pressure (Exercise) 158/84 158/82 152/74  - 142/84   Blood Pressure (Exit) 146/74 128/64 128/62  - 126/60   Heart Rate (Admit) 69 bpm 80 bpm 88 bpm  - 84 bpm   Heart Rate (Exercise) 90 bpm 135 bpm 95 bpm  - 90 bpm   Heart Rate (Exit) 85 bpm 68 bpm 78 bpm  - 86 bpm   Oxygen Saturation (Exercise) 89 %  -  -  -  -   Rating of Perceived Exertion (Exercise) '15 13 11  '$ - 12   Perceived Dyspnea (Exercise) 5  -  -  -  -   Symptoms  - none none  - none   Duration  - Progress to 45 minutes of aerobic exercise without signs/symptoms of physical distress Progress to 45 minutes of aerobic exercise without signs/symptoms of physical distress  - Progress to 45 minutes of aerobic exercise without signs/symptoms of physical distress   Intensity   - THRR unchanged THRR unchanged  - THRR unchanged  Progression   Progression  -  - Continue to progress workloads to maintain intensity without signs/symptoms of physical distress.  - Continue to progress workloads to maintain intensity without signs/symptoms of physical distress.   Average METs  -  - 2.2  - 2.15     Resistance Training   Training Prescription  - Yes Yes  - Yes   Weight  - 2 2  - 4   Reps  - 10-15 10-15  - 10-15     Interval Training   Interval Training  - No No  -  -     Treadmill   MPH  - 1  -  - 1.5   Grade  - 0  -  - 0   Minutes  - 15  -  - 15   METs  - 1.77  -  - 2.15     NuStep   Level  - 1 1  - 4   Minutes  - 15 15  - 15   METs  - 1.7  -  -  -     Recumbant Elliptical   Level  -  - 3  -  -   Minutes  -  - 15  -  -     Home Exercise Plan   Plans to continue exercise at  -  -  - [place]  -   Frequency  -  -  - Add 1 additional day to program exercise sessions.  -   [place] Name 06/25/16 1300             Exercise Review   Progression Yes         Response to Exercise   Blood Pressure (Admit) 138/70       Blood Pressure (Exercise) 184/78       Blood Pressure (Exit) 140/82       Heart Rate (Admit) 75 bpm       Heart Rate (Exercise) 101 bpm       Heart Rate (Exit) 84 bpm       Rating of Perceived Exertion (Exercise) 12       Symptoms none       Duration Progress to 45 minutes of aerobic exercise without signs/symptoms of physical distress       Intensity THRR unchanged         Progression   Progression Continue to progress workloads to maintain intensity without signs/symptoms of physical distress.       Average METs 2.28         Resistance Training   Training Prescription Yes       Weight 4       Reps 10-15         Treadmill   MPH 1.5       Grade 0       Minutes 15       METs 2.15         NuStep   Level 4       Minutes 15       METs 2.4          Exercise Comments:     Exercise Comments    Row Name 04/28/16 1448  05/14/16 1205 05/28/16 1141 06/03/16 1739 06/11/16 1242   Exercise Comments Exercise goals include breathing better and able to be more active. Awad is progressing well with exercise. Zackory has progressed well with exercise. Home exercise discussed with Sonia Side today. HR RPE  and outdoor exercise Est[place] is progressing well with exercise.   Row Name 06/25/16 1350           Exercise Comments Bennet continues to progress well with exercise.          Discharge Exercise Prescription (Final Exercise Prescription Changes):     Exercise Prescription Changes - 06/25/16 1300      Exercise Review   Progression Yes     Response to Exercise   Blood Pressure (Admit) 138/70   Blood Pressure (Exercise) 184/78   Blood Pressure (Exit) 140/82   Heart Rate (Admit) 75 bpm   Heart Rate (Exercise) 101 bpm   Heart Rate (Exit) 84 bpm   Rating of Perceived Exertion (Exercise) 12   Symptoms none   Duration Progress to 45 minutes of aerobic exercise without signs/symptoms of physical distress   Intensity THRR unchanged     Progression   Progression Continue to progress workloads to maintain intensity without signs/symptoms of physical distress.   Average METs 2.28     Resistance Training   Training Prescription Yes   Weight 4   Reps 10-15     Treadmill   MPH 1.5   Grade 0   Minutes 15   METs 2.15     NuStep   Level 4   Minutes 15   METs 2.4      Nutrition:  Target Goals: Understanding of nutrition guidelines, daily intake of sodium '1500mg'$ , cholesterol '200mg'$ , calories 30% from fat and 7% or less from saturated fats, daily to have 5 or more servings of fruits and vegetables.  Biometrics:     Pre Biometrics - 04/28/16 1445      Pre Biometrics   Height 6' 0.75" (1.848 m)   Weight 206 lb 8 oz (93.7 kg)   Waist Circumference 43.25 inches   Hip Circumference 41 inches   Waist to Hip Ratio 1.05 %   BMI (Calculated) 27.5   Single Leg Stand 4.41 seconds       Nutrition Therapy Plan  and Nutrition Goals:     Nutrition Therapy & Goals - 04/28/16 1515      Intervention Plan   Intervention Prescribe, educate and counsel regarding individualized specific dietary modifications aiming towards targeted core components such as weight, hypertension, lipid management, diabetes, heart failure and other comorbidities.   Expected Outcomes Short Term Goal: Understand basic principles of dietary content, such as calories, fat, sodium, cholesterol and nutrients.      Nutrition Discharge: Rate Your Plate Scores:     Nutrition Assessments - 06/22/16 1532      Rate Your Plate Scores   Pre Score 68   Pre Score % 76 %   Post Score 72   Post Score % 80 %   % Change 4 %      Nutrition Goals Re-Evaluation:     Nutrition Goals Re-Evaluation    Row Name 05/07/16 1805 07/03/16 1232           Personal Goal #1 Re-Evaluation   Personal Goal #1 Heart healthy diet  -      Comments March mentioned that he went to the nursing home about every day for 3 years when his wife was in the nursing home before she died. Adelaido said he got used to feeding her lunch then returning home and having his lunch at 2pm. Jaelyn has diabetes. Schneur said he doesn't feel the need to see the Cardiac Rehab Registered Dietician since he has been dealing with  the diabetes for years and low sodium diet. Jajuan said his blood sugars normally run between 100-120.          Psychosocial: Target Goals: Acknowledge presence or absence of depression, maximize coping skills, provide positive support system. Participant is able to verbalize types and ability to use techniques and skills needed for reducing stress and depression.  Initial Review & Psychosocial Screening:     Initial Psych Review & Screening - 05/07/16 1804      Family Dynamics   Comments Jailan mentioned that he went to the nursing home about every day for 3 years when his wife was in the nursing home before she died. Ardith said he got used to feeding  her lunch then returning home and having his lunch at 2pm      Quality of Life Scores:     Quality of Life - 06/22/16 1532      Quality of Life Scores   Health/Function Pre 9.89 %   Health/Function Post 20 %   Health/Function % Change 102.22 %   Socioeconomic Pre 24.29 %   Socioeconomic Post 20.27 %   Socioeconomic % Change  -16.55 %   Psych/Spiritual Pre 20.71 %   Psych/Spiritual Post 19.43 %   Psych/Spiritual % Change -6.18 %   Family Pre 58.5 %   Family Post 21 %   Family % Change -64.1 %   GLOBAL Pre 17.73 %   GLOBAL Post 20 %   GLOBAL % Change 12.8 %      PHQ-9: Recent Review Flowsheet Data    Depression screen [facility] 2/9 06/22/2016 04/28/2016   Decreased Interest 2 1   Down, Depressed, Hopeless 1 1   PHQ - 2 Score 3 2   Altered sleeping 1 1   Tired, decreased energy 2 1   Change in appetite 2 1   Feeling bad or failure about yourself  0 0   Trouble concentrating 0 0   Moving slowly or fidgety/restless 0 0   Suicidal thoughts 0 0   PHQ-9 Score 8 5   Difficult doing work/chores Somewhat difficult Somewhat difficult      Psychosocial Evaluation and Intervention:     Psychosocial Evaluation - 05/25/16 1720      Psychosocial Evaluation & Interventions   Comments Counselor met with Mr. Wedemeyer today for initial psychosocial evaluation.  He is a 70 year old gentleman who has been in this program for several weeks now.  He reports already experiencing a great deal of progress with improved breathing; energy and mood since coming to Cardiac Rehab.  Mr. Fjeld reports his spouse passed away about a year ago and he has been in and out of the hospital since that time with strokes and other health problems.  He states his doctors changed his diuretics just prior to his coming here and that has made a huge difference as he has lost 12 pounds and having less fluid helps him feel better overall.  Mr. Duchene is optimistic at this time that he will continue to improve during the  remainder of this program.  Counselor will continue to follow with him.        Psychosocial Re-Evaluation:     Psychosocial Re-Evaluation    [facility] Name 07/03/16 1226 07/03/16 1235           Psychosocial Re-Evaluation   Interventions Encouraged to attend Cardiac Rehabilitation for the exercise  -      Comments I asked Fernand about his wife  death and they were married 28 years. She was 69 when she got sick and 8 years of dealing with her sickness and last 3 years in a nursing home. "I just couldn't take care of her anymore". Sometimes like the Cardiac rehab class where the other person was talking about his family member death. I couldn't attend the next time in Cardiac REhab but some days are better than others.  Yannick said financially he finally got approved for 100% VA disability vs 20% so his income checks have gone up so less stress on him.          Vocational Rehabilitation: Provide vocational rehab assistance to qualifying candidates.   Vocational Rehab Evaluation & Intervention:     Vocational Rehab - 04/28/16 1525      Initial Vocational Rehab Evaluation & Intervention   Assessment shows need for Vocational Rehabilitation No      Education: Education Goals: Education classes will be provided on a weekly basis, covering required topics. Participant will state understanding/return demonstration of topics presented.  Learning Barriers/Preferences:     Learning Barriers/Preferences - 04/28/16 1525      Learning Barriers/Preferences   Learning Barriers None   Learning Preferences None      Education Topics: General Nutrition Guidelines/Fats and Fiber: -Group instruction provided by verbal, written material, models and posters to present the general guidelines for heart healthy nutrition. Gives an explanation and review of dietary fats and fiber. Flowsheet Row Cardiac Rehab from 07/01/2016 in [facility] Cardiac and Pulmonary Rehab  Date  05/25/16  Educator  PI  Instruction  Review Code  2- meets goals/outcomes      Controlling Sodium/Reading Food Labels: -Group verbal and written material supporting the discussion of sodium use in heart healthy nutrition. Review and explanation with models, verbal and written materials for utilization of the food label. Flowsheet Row Cardiac Rehab from 07/01/2016 in [facility] Cardiac and Pulmonary Rehab  Date  06/01/16  Educator  PI  Instruction Review Code  2- meets goals/outcomes      Exercise Physiology & Risk Factors: - Group verbal and written instruction with models to review the exercise physiology of the cardiovascular system and associated critical values. Details cardiovascular disease risk factors and the goals associated with each risk factor. Flowsheet Row Cardiac Rehab from 07/01/2016 in [facility] Cardiac and Pulmonary Rehab  Date  06/08/16  Educator  [facility]  Instruction Review Code  2- meets goals/outcomes      Aerobic Exercise & Resistance Training: - Gives group verbal and written discussion on the health impact of inactivity. On the components of aerobic and resistive training programs and the benefits of this training and how to safely progress through these programs. Flowsheet Row Cardiac Rehab from 07/01/2016 in [facility] Cardiac and Pulmonary Rehab  Date  06/10/16  Educator  AS  Instruction Review Code  2- meets goals/outcomes      Flexibility, Balance, General Exercise Guidelines: - Provides group verbal and written instruction on the benefits of flexibility and balance training programs. Provides general exercise guidelines with specific guidelines to those with heart or lung disease. Demonstration and skill practice provided. Flowsheet Row Cardiac Rehab from 07/01/2016 in [facility] Cardiac and Pulmonary Rehab  Date  06/15/16  Educator  [facility]  Instruction Review Code  2- meets goals/outcomes      Stress Management: - Provides group verbal and written instruction about the health risks of elevated stress, cause of  high stress, and healthy ways to reduce stress. [facility] Re[facility]  from 07/01/2016 in [facility] Cardiac and Pulmonary Rehab  Date  06/24/16  Educator  kc  Instruction Review Code  2- meets goals/outcomes      Depression: - Provides group verbal and written instruction on the correlation between heart/lung disease and depressed mood, treatment options, and the stigmas associated with seeking treatment. Flowsheet Row Cardiac Rehab from 07/01/2016 in [facility] Cardiac and Pulmonary Rehab  Date  05/27/16  Educator  [facility]  Instruction Review Code  2- meets goals/outcomes      Anatomy & Physiology of the Heart: - Group verbal and written instruction and models provide basic cardiac anatomy and physiology, with the coronary electrical and arterial systems. Review of: AMI, Angina, Valve disease, Heart Failure, Cardiac Arrhythmia, Pacemakers, and the ICD. Flowsheet Row Cardiac Rehab from 07/01/2016 in [facility] Cardiac and Pulmonary Rehab  Date  06/22/16  Educator  CE  Instruction Review Code  2- meets goals/outcomes      Cardiac Procedures: - Group verbal and written instruction and models to describe the testing methods done to diagnose heart disease. Reviews the outcomes of the test results. Describes the treatment choices: Medical Management, Angioplasty, or Coronary Bypass Surgery.   Cardiac Medications: - Group verbal and written instruction to review commonly prescribed medications for heart disease. Reviews the medication, class of the drug, and side effects. Includes the steps to properly store meds and maintain the prescription regimen. Flowsheet Row Cardiac Rehab from 07/01/2016 in [facility] Cardiac and Pulmonary Rehab  Date  07/01/16 Marisue Humble 1]  Educator  SB  Instruction Review Code  2- meets goals/outcomes      Go Sex-Intimacy & Heart Disease, Get SMART - Goal Setting: - Group verbal and written instruction through game format to discuss heart disease and the return to sexual intimacy.  Provides group verbal and written material to discuss and apply goal setting through the application of the S.M.A.R.T. Method.   Other Matters of the Heart: - Provides group verbal, written materials and models to describe Heart Failure, Angina, Valve Disease, and Diabetes in the realm of heart disease. Includes description of the disease process and treatment options available to the cardiac patient. Flowsheet Row Cardiac Rehab from 07/01/2016 in [facility] Cardiac and Pulmonary Rehab  Date  06/22/16  Educator  CE  Instruction Review Code  2- meets goals/outcomes      Exercise & Equipment Safety: - Individual verbal instruction and demonstration of equipment use and safety with use of the equipment. Flowsheet Row Cardiac Rehab from 07/01/2016 in [facility] Cardiac and Pulmonary Rehab  Date  04/28/16  Educator  SB  Instruction Review Code  2- meets goals/outcomes      Infection Prevention: - Provides verbal and written material to individual with discussion of infection control including proper hand washing and proper equipment cleaning during exercise session. Flowsheet Row Cardiac Rehab from 07/01/2016 in [facility] Cardiac and Pulmonary Rehab  Date  04/28/16  Educator  SB  Instruction Review Code  2- meets goals/outcomes      Falls Prevention: - Provides verbal and written material to individual with discussion of falls prevention and safety.   Diabetes: - Individual verbal and written instruction to review signs/symptoms of diabetes, desired ranges of glucose level fasting, after meals and with exercise. Advice that pre and post exercise glucose checks will be done for 3 sessions at entry of program. [place] from 07/01/2016 in [facility] Cardiac and Pulmonary Rehab  Date  04/28/16  Educator  SB  Instruction Review Code  2- meets  goals/outcomes       Knowledge Questionnaire Score:     Knowledge Questionnaire Score - 06/22/16 1534      Knowledge Questionnaire Score    Pre Score 24/28   Post Score 28/28      Core Components/Risk Factors/Patient Goals at Admission:     Personal Goals and Risk Factors at Admission - 04/28/16 1330      Core Components/Risk Factors/Patient Goals on Admission    Weight Management Obesity;Weight Maintenance;Yes   Intervention Weight Management: Develop a combined nutrition and exercise program designed to reach desired caloric intake, while maintaining appropriate intake of nutrient and fiber, sodium and fats, and appropriate energy expenditure required for the weight goal.;Weight Management: Provide education and appropriate resources to help participant work on and attain dietary goals.;Weight Management/Obesity: Establish reasonable short term and long term weight goals.;Obesity: Provide education and appropriate resources to help participant work on and attain dietary goals.   Admit Weight 202 lb (91.6 kg)   Goal Weight: Short Term 197 lb (89.4 kg)   Goal Weight: Long Term 180 lb (81.6 kg)   Expected Outcomes Short Term: Continue to assess and modify interventions until short term weight is achieved;Long Term: Adherence to nutrition and physical activity/exercise program aimed toward attainment of established weight goal   Sedentary Yes   Intervention Provide advice, education, support and counseling about physical activity/exercise needs.;Develop an individualized exercise prescription for aerobic and resistive training based on initial evaluation findings, risk stratification, comorbidities and participant's personal goals.   Expected Outcomes Achievement of increased cardiorespiratory fitness and enhanced flexibility, muscular endurance and strength shown through measurements of functional capacity and personal statement of participant.   Increase Strength and Stamina Yes   Intervention Provide advice, education, support and counseling about physical activity/exercise needs.;Develop an individualized exercise prescription for  aerobic and resistive training based on initial evaluation findings, risk stratification, comorbidities and participant's personal goals.   Expected Outcomes Achievement of increased cardiorespiratory fitness and enhanced flexibility, muscular endurance and strength shown through measurements of functional capacity and personal statement of participant.   Improve shortness of breath with ADL's Yes   Intervention Provide education, individualized exercise plan and daily activity instruction to help decrease symptoms of SOB with activities of daily living.   Expected Outcomes Short Term: Achieves a reduction of symptoms when performing activities of daily living.   Diabetes Yes  1990 diagnosed.  Insulin started one year ago.    Intervention Provide education about signs/symptoms and action to take for hypo/hyperglycemia.;Provide education about proper nutrition, including hydration, and aerobic/resistive exercise prescription along with prescribed medications to achieve blood glucose in normal ranges: Fasting glucose 65-99 mg/dL   Expected Outcomes Short Term: Participant verbalizes understanding of the signs/symptoms and immediate care of hyper/hypoglycemia, proper foot care and importance of medication, aerobic/resistive exercise and nutrition plan for blood glucose control.;Long Term: Attainment of HbA1C < 7%.   Heart Failure Yes   Intervention Provide a combined exercise and nutrition program that is supplemented with education, support and counseling about heart failure. Directed toward relieving symptoms such as shortness of breath, decreased exercise tolerance, and extremity edema.   Expected Outcomes Improve functional capacity of life   Hypertension Yes   Intervention Provide education on lifestyle modifcations including regular physical activity/exercise, weight management, moderate sodium restriction and increased consumption of fresh fruit, vegetables, and low fat dairy, alcohol moderation,  and smoking cessation.;Monitor prescription use compliance.   Expected Outcomes Short Term: Continued assessment and intervention until BP is < 140/3m  HG in hypertensive participants. < 130/48m HG in hypertensive participants with diabetes, heart failure or chronic kidney disease.;Long Term: Maintenance of blood pressure at goal levels.   Lipids Yes   Intervention Provide education and support for participant on nutrition & aerobic/resistive exercise along with prescribed medications to achieve LDL '70mg'$ , HDL >'40mg'$ .   Expected Outcomes Short Term: Participant states understanding of desired cholesterol values and is compliant with medications prescribed. Participant is following exercise prescription and nutrition guidelines.;Long Term: Cholesterol controlled with medications as prescribed, with individualized exercise RX and with personalized nutrition plan. Value goals: LDL < '70mg'$ , HDL > 40 mg.      Core Components/Risk Factors/Patient Goals Review:      Goals and Risk Factor Review    Row Name 05/18/16 1802 06/01/16 1744 07/03/16 1230         Core Components/Risk Factors/Patient Goals Review   Personal Goals Review Weight Management/Obesity;Diabetes;Lipids;Heart Failure Weight Management/Obesity;Improve shortness of breath with ADL's  -     Review Spoke with JCarlisletoday about his goals.   JJunohas seen weight drop seveal poundds and is not seeing fluid retention of 2-3 pound flucuation since has changed diurectic. Diabetes is staying well controlled along with cholesterol and heart failure symptoms JRayanereports he is down to 192 lbs with his clothes on. Has met his short term goal for weight management.  Shortness of breath symptoms are improvng with fluid loss and exercise routine JShivanshsaid his weight has been up a couple of lbs so went back to taking his diuretic. One extra diuretic bothered his GI tract.      Expected Outcomes Continue with weight loss following exercise and nutrition  plans. Maintian control of heart failure symptoms, cholesterol levels, through prescribed meds. exercise and nutrition plans. Continue with weight loss following exercise and nutrition . Continued maintainence of fluid levels to aid in decreasing SOB Cont heart healthy lifestyle and cont his diabetes.        Core Components/Risk Factors/Patient Goals at Discharge (Final Review):      Goals and Risk Factor Review - 07/03/16 1230      Core Components/Risk Factors/Patient Goals Review   Review JAntiniosaid his weight has been up a couple of lbs so went back to taking his diuretic. One extra diuretic bothered his GI tract.    Expected Outcomes Cont heart healthy lifestyle and cont his diabetes.      ITP Comments:     ITP Comments    Row Name 04/28/16 1509 05/14/16 1205 05/20/16 0716 06/03/16 1739 06/17/16 1112   ITP Comments Initial ITP created today during medical review.  Diagnosis documentation notes sent for scan into [facility]. JTirthis progressing well with exercise. 30 day review. Continue with ITP unless changes noted by Medical Director at signature of review. Home exercise discussed with JSonia Sidetoday. HR RPE and outdoor exercise 30 day review. Continue with ITP unless changes noted by Medical Director at signature of review.      Comments:

## 2016-07-06 ENCOUNTER — Encounter: Payer: Non-veteran care | Admitting: *Deleted

## 2016-07-06 DIAGNOSIS — I5042 Chronic combined systolic (congestive) and diastolic (congestive) heart failure: Secondary | ICD-10-CM | POA: Diagnosis not present

## 2016-07-06 DIAGNOSIS — I5032 Chronic diastolic (congestive) heart failure: Secondary | ICD-10-CM

## 2016-07-06 NOTE — Progress Notes (Signed)
Daily Session Note  Patient Details  Name: [name] MRN: [id number] Date of Birth: 21-Jul-1946 Referring Provider:   Flowsheet Row Cardiac Rehab from 04/28/2016 in [facility] Cardiac and Pulmonary Rehab  Referring Provider  [facility]      Encounter Date: 07/06/2016  Check In:     Session Check In - 07/06/16 1812      Check-In   Staff Present Heath Lark, RN, BSN, CCRP;Carroll Enterkin, RN, Moises Blood, BS, ACSM CEP, Exercise Physiologist   Supervising physician immediately available to respond to emergencies See telemetry face sheet for immediately available ER MD   Medication changes reported     No   Fall or balance concerns reported    No   Resistance Training Performed Yes   VAD Patient? No     Pain Assessment   Currently in Pain? No/denies         Goals Met:  Exercise tolerated well No report of cardiac concerns or symptoms Strength training completed today  Goals Unmet:  Not Applicable  Comments: Doing well with exercise prescription progression.    Dr. Emily Filbert is Medical Director for [place] and [facility].

## 2016-07-08 ENCOUNTER — Encounter: Payer: Non-veteran care | Attending: Family

## 2016-07-08 VITALS — Ht 72.75 in

## 2016-07-08 DIAGNOSIS — R0602 Shortness of breath: Secondary | ICD-10-CM | POA: Diagnosis not present

## 2016-07-08 DIAGNOSIS — E785 Hyperlipidemia, unspecified: Secondary | ICD-10-CM | POA: Insufficient documentation

## 2016-07-08 DIAGNOSIS — Z794 Long term (current) use of insulin: Secondary | ICD-10-CM | POA: Insufficient documentation

## 2016-07-08 DIAGNOSIS — I5032 Chronic diastolic (congestive) heart failure: Secondary | ICD-10-CM

## 2016-07-08 DIAGNOSIS — I5042 Chronic combined systolic (congestive) and diastolic (congestive) heart failure: Secondary | ICD-10-CM | POA: Diagnosis not present

## 2016-07-08 DIAGNOSIS — I10 Essential (primary) hypertension: Secondary | ICD-10-CM | POA: Insufficient documentation

## 2016-07-08 DIAGNOSIS — E119 Type 2 diabetes mellitus without complications: Secondary | ICD-10-CM | POA: Insufficient documentation

## 2016-07-08 DIAGNOSIS — I4891 Unspecified atrial fibrillation: Secondary | ICD-10-CM | POA: Diagnosis not present

## 2016-07-08 DIAGNOSIS — E669 Obesity, unspecified: Secondary | ICD-10-CM | POA: Insufficient documentation

## 2016-07-08 DIAGNOSIS — Z87891 Personal history of nicotine dependence: Secondary | ICD-10-CM | POA: Diagnosis not present

## 2016-07-08 NOTE — Progress Notes (Signed)
Daily Session Note  Patient Details  Name: ROMAINE NEVILLE MRN: [id number] Date of Birth: [birth year] Referring Provider:   Flowsheet Row Cardiac Rehab from 04/28/2016 in [facility] Cardiac and Pulmonary Rehab  Referring Provider  [facility]      Encounter Date: 07/08/2016  Check In:     Session Check In - 07/08/16 1733      Check-In   Location [facility]-Cardiac & Pulmonary Rehab   Staff Present Nada Maclachlan, BA, ACSM CEP, Exercise Physiologist;Rebecca Brayton El, DPT, CEEA   Supervising physician immediately available to respond to emergencies See telemetry face sheet for immediately available ER MD   Medication changes reported     No   Fall or balance concerns reported    No   Warm-up and Cool-down Performed on first and last piece of equipment           Exercise Prescription Changes - 07/08/16 1400      Exercise Review   Progression Yes     Response to Exercise   Blood Pressure (Admit) 142/82   Blood Pressure (Exercise) 164/82   Blood Pressure (Exit) 124/64   Heart Rate (Admit) 86 bpm   Heart Rate (Exercise) 100 bpm   Heart Rate (Exit) 84 bpm   Rating of Perceived Exertion (Exercise) 12   Symptoms none   Duration Progress to 45 minutes of aerobic exercise without signs/symptoms of physical distress   Intensity THRR unchanged     Progression   Progression Continue to progress workloads to maintain intensity without signs/symptoms of physical distress.   Average METs 2.75     Resistance Training   Training Prescription Yes   Weight 4   Reps 10-15     Interval Training   Interval Training No     NuStep   Level 4   Minutes 15   METs 3.5     Recumbant Elliptical   Level 3   Minutes 15   METs 2      Goals Met:  Independence with exercise equipment Exercise tolerated well No report of cardiac concerns or symptoms Strength training completed today  Goals Unmet:  Not Applicable  Comments:  Aristotelis graduated today from cardiac rehab with 36 sessions  completed.  Details of the patient's exercise prescription and what he needs to do in order to continue the prescription and progress were discussed with patient.  Patient was given a copy of prescription and goals.  Patient verbalized understanding.  Kellar plans to continue to exercise by going to [employer].    Dr. Emily Filbert is Medical Director for [place] and [facility].

## 2016-07-10 DIAGNOSIS — I5032 Chronic diastolic (congestive) heart failure: Secondary | ICD-10-CM

## 2016-07-10 NOTE — Progress Notes (Signed)
Discharge Summary  Patient Details  Name: Victor [name] MRN: [id number] Date of Birth: 01/12/1946 Referring Provider:   Flowsheet Row Cardiac Rehab from 04/28/2016 in [facility] Cardiac and Pulmonary Rehab  Referring Provider  [facility]       Number of Visits: 36  Reason for Discharge:  Patient reached a stable level of exercise. Patient independent in their exercise.  Smoking History:  History  Smoking Status  . Former Smoker  Smokeless Tobacco  . Not on file    Diagnosis:  No diagnosis found.  ADL UCSD:   Initial Exercise Prescription:     Initial Exercise Prescription - 04/28/16 1400      Date of Initial Exercise RX and Referring Provider   Date 04/28/16   Referring Provider [facility]     Treadmill   MPH 1   Grade 0   Minutes '15  5 5 5   '$ METs 1.77     NuStep   Level 1   Minutes 15   METs 1.7     Recumbant Elliptical   Level 1   RPM 40   Minutes 15   METs 1.7     Prescription Details   Frequency (times per week) 3   Duration Progress to 45 minutes of aerobic exercise without signs/symptoms of physical distress     Intensity   THRR 40-80% of Max Heartrate 102-135   Ratings of Perceived Exertion 11-15   Perceived Dyspnea 0-4     Progression   Progression Continue to progress workloads to maintain intensity without signs/symptoms of physical distress.     Resistance Training   Training Prescription Yes   Weight 2 lbs   Reps 10-15      Discharge Exercise Prescription (Final Exercise Prescription Changes):     Exercise Prescription Changes - 07/08/16 1400      Exercise Review   Progression Yes     Response to Exercise   Blood Pressure (Admit) 142/82   Blood Pressure (Exercise) 164/82   Blood Pressure (Exit) 124/64   Heart Rate (Admit) 86 bpm   Heart Rate (Exercise) 100 bpm   Heart Rate (Exit) 84 bpm   Rating of Perceived Exertion (Exercise) 12   Symptoms none   Duration Progress to 45 minutes of aerobic exercise  without signs/symptoms of physical distress   Intensity THRR unchanged     Progression   Progression Continue to progress workloads to maintain intensity without signs/symptoms of physical distress.   Average METs 2.75     Resistance Training   Training Prescription Yes   Weight 4   Reps 10-15     Interval Training   Interval Training No     NuStep   Level 4   Minutes 15   METs 3.5     Recumbant Elliptical   Level 3   Minutes 15   METs 2      Functional Capacity:     6 Minute Walk    Row Name 04/28/16 1442 07/08/16 1718       6 Minute Walk   Phase Initial Discharge    Distance 700 feet 1000 feet    Distance % Change  - 43 %    Walk Time 5.43 minutes 6 minutes    # of Rest Breaks 3  2 sec, 26 sec, 6 sec 0    MPH 1.46 1.9    METS 1.96 2.6    RPE 14 12    Perceived Dyspnea  5  -  VO2 Peak 6.85 9.15    Symptoms Yes (comment) No    Comments tired, SOB, low back and hip pain  -    Resting HR 69 bpm 88 bpm    Resting BP 134/56 138/74    Max Ex. HR 90 bpm 95 bpm    Max Ex. BP 154/84 142/70    2 Minute Post BP 146/74  -       Psychological, QOL, Others - Outcomes: PHQ 2/9: Depression screen [facility] 2/9 06/22/2016 04/28/2016  Decreased Interest 2 1  Down, Depressed, Hopeless 1 1  PHQ - 2 Score 3 2  Altered sleeping 1 1  Tired, decreased energy 2 1  Change in appetite 2 1  Feeling bad or failure about yourself  0 0  Trouble concentrating 0 0  Moving slowly or fidgety/restless 0 0  Suicidal thoughts 0 0  PHQ-9 Score 8 5  Difficult doing work/chores Somewhat difficult Somewhat difficult    Quality of Life:     Quality of Life - 06/22/16 1532      Quality of Life Scores   Health/Function Pre 9.89 %   Health/Function Post 20 %   Health/Function % Change 102.22 %   Socioeconomic Pre 24.29 %   Socioeconomic Post 20.27 %   Socioeconomic % Change  -16.55 %   Psych/Spiritual Pre 20.71 %   Psych/Spiritual Post 19.43 %   Psych/Spiritual % Change -6.18 %    Family Pre 58.5 %   Family Post 21 %   Family % Change -64.1 %   GLOBAL Pre 17.73 %   GLOBAL Post 20 %   GLOBAL % Change 12.8 %      Personal Goals: Goals established at orientation with interventions provided to work toward goal.     Personal Goals and Risk Factors at Admission - 04/28/16 1330      Core Components/Risk Factors/Patient Goals on Admission    Weight Management Obesity;Weight Maintenance;Yes   Intervention Weight Management: Develop a combined nutrition and exercise program designed to reach desired caloric intake, while maintaining appropriate intake of nutrient and fiber, sodium and fats, and appropriate energy expenditure required for the weight goal.;Weight Management: Provide education and appropriate resources to help participant work on and attain dietary goals.;Weight Management/Obesity: Establish reasonable short term and long term weight goals.;Obesity: Provide education and appropriate resources to help participant work on and attain dietary goals.   Admit Weight 202 lb (91.6 kg)   Goal Weight: Short Term 197 lb (89.4 kg)   Goal Weight: Long Term 180 lb (81.6 kg)   Expected Outcomes Short Term: Continue to assess and modify interventions until short term weight is achieved;Long Term: Adherence to nutrition and physical activity/exercise program aimed toward attainment of established weight goal   Sedentary Yes   Intervention Provide advice, education, support and counseling about physical activity/exercise needs.;Develop an individualized exercise prescription for aerobic and resistive training based on initial evaluation findings, risk stratification, comorbidities and participant's personal goals.   Expected Outcomes Achievement of increased cardiorespiratory fitness and enhanced flexibility, muscular endurance and strength shown through measurements of functional capacity and personal statement of participant.   Increase Strength and Stamina Yes   Intervention  Provide advice, education, support and counseling about physical activity/exercise needs.;Develop an individualized exercise prescription for aerobic and resistive training based on initial evaluation findings, risk stratification, comorbidities and participant's personal goals.   Expected Outcomes Achievement of increased cardiorespiratory fitness and enhanced flexibility, muscular endurance and strength shown through measurements of functional  capacity and personal statement of participant.   Improve shortness of breath with ADL's Yes   Intervention Provide education, individualized exercise plan and daily activity instruction to help decrease symptoms of SOB with activities of daily living.   Expected Outcomes Short Term: Achieves a reduction of symptoms when performing activities of daily living.   Diabetes Yes  1990 diagnosed.  Insulin started one year ago.    Intervention Provide education about signs/symptoms and action to take for hypo/hyperglycemia.;Provide education about proper nutrition, including hydration, and aerobic/resistive exercise prescription along with prescribed medications to achieve blood glucose in normal ranges: Fasting glucose 65-99 mg/dL   Expected Outcomes Short Term: Participant verbalizes understanding of the signs/symptoms and immediate care of hyper/hypoglycemia, proper foot care and importance of medication, aerobic/resistive exercise and nutrition plan for blood glucose control.;Long Term: Attainment of HbA1C < 7%.   Heart Failure Yes   Intervention Provide a combined exercise and nutrition program that is supplemented with education, support and counseling about heart failure. Directed toward relieving symptoms such as shortness of breath, decreased exercise tolerance, and extremity edema.   Expected Outcomes Improve functional capacity of life   Hypertension Yes   Intervention Provide education on lifestyle modifcations including regular physical activity/exercise,  weight management, moderate sodium restriction and increased consumption of fresh fruit, vegetables, and low fat dairy, alcohol moderation, and smoking cessation.;Monitor prescription use compliance.   Expected Outcomes Short Term: Continued assessment and intervention until BP is < 140/73m HG in hypertensive participants. < 130/871mHG in hypertensive participants with diabetes, heart failure or chronic kidney disease.;Long Term: Maintenance of blood pressure at goal levels.   Lipids Yes   Intervention Provide education and support for participant on nutrition & aerobic/resistive exercise along with prescribed medications to achieve LDL '70mg'$ , HDL >'40mg'$ .   Expected Outcomes Short Term: Participant states understanding of desired cholesterol values and is compliant with medications prescribed. Participant is following exercise prescription and nutrition guidelines.;Long Term: Cholesterol controlled with medications as prescribed, with individualized exercise RX and with personalized nutrition plan. Value goals: LDL < '70mg'$ , HDL > 40 mg.       Personal Goals Discharge:     Goals and Risk Factor Review    Row Name 05/18/16 1802 06/01/16 1744 07/03/16 1230         Core Components/Risk Factors/Patient Goals Review   Personal Goals Review Weight Management/Obesity;Diabetes;Lipids;Heart Failure Weight Management/Obesity;Improve shortness of breath with ADL's  -     Review Spoke with Victor [name] about his goals.   Victor [name] seen weight drop seveal poundds and is not seeing fluid retention of 2-3 pound flucuation since has changed diurectic. Diabetes is staying well controlled along with cholesterol and heart failure symptoms Victor [name] he is down to 192 lbs with his clothes on. Has met his short term goal for weight management.  Shortness of breath symptoms are improvng with fluid loss and exercise routine Victor [name] his weight has been up a couple of lbs so went back to taking his diuretic. One extra  diuretic bothered his GI tract.      Expected Outcomes Continue with weight loss following exercise and nutrition plans. Maintian control of heart failure symptoms, cholesterol levels, through prescribed meds. exercise and nutrition plans. Continue with weight loss following exercise and nutrition . Continued maintainence of fluid levels to aid in decreasing SOB Cont heart healthy lifestyle and cont his diabetes.        Nutrition & Weight - Outcomes:     Pre Biometrics -  04/28/16 1445      Pre Biometrics   Height 6' 0.75" (1.848 m)   Weight 206 lb 8 oz (93.7 kg)   Waist Circumference 43.25 inches   Hip Circumference 41 inches   Waist to Hip Ratio 1.05 %   BMI (Calculated) 27.5   Single Leg Stand 4.41 seconds         Post Biometrics - 07/08/16 1717       Post  Biometrics   Height 6' 0.75" (1.848 m)   Waist Circumference 43 inches   Hip Circumference 39 inches   Waist to Hip Ratio 1.1 %      Nutrition:     Nutrition Therapy & Goals - 04/28/16 1515      Intervention Plan   Intervention Prescribe, educate and counsel regarding individualized specific dietary modifications aiming towards targeted core components such as weight, hypertension, lipid management, diabetes, heart failure and other comorbidities.   Expected Outcomes Short Term Goal: Understand basic principles of dietary content, such as calories, fat, sodium, cholesterol and nutrients.      Nutrition Discharge:     Nutrition Assessments - 06/22/16 1532      Rate Your Plate Scores   Pre Score 68   Pre Score % 76 %   Post Score 72   Post Score % 80 %   % Change 4 %      Education Questionnaire Score:     Knowledge Questionnaire Score - 06/22/16 1534      Knowledge Questionnaire Score   Pre Score 24/28   Post Score 28/28      Goals reviewed with patient; copy given to patient.

## 2016-07-10 NOTE — Progress Notes (Signed)
Cardiac Individual Treatment Plan  Patient Details  Name: Victor [name] MRN: [id number] Date of Birth: 23-Aug-1946 Referring Provider:   Flowsheet Row Cardiac Rehab from 04/28/2016 in [facility] Cardiac and Pulmonary Rehab  Referring Provider  [facility]      Initial Encounter Date:  [place] from 04/28/2016 in [facility] Cardiac and Pulmonary Rehab  Date  04/28/16  Referring Provider  [facility]      Visit Diagnosis: Heart failure, diastolic, chronic ([place])  Patient's Home Medications on Admission:  Current Outpatient Prescriptions:  .  allopurinol (ZYLOPRIM) 100 MG tablet, Take 200 mg by mouth daily., Disp: , Rfl:  .  amLODipine (NORVASC) 10 MG tablet, Take 10 mg by mouth daily., Disp: , Rfl:  .  ammonium lactate (AMLACTIN) 12 % cream, Apply 1 g topically as needed for dry skin. Reported on 10/24/2015, Disp: , Rfl:  .  apixaban (ELIQUIS) 5 MG TABS tablet, Take 5 mg by mouth 2 (two) times daily., Disp: , Rfl:  .  carvedilol (COREG) 25 MG tablet, Take 25 mg by mouth every 12 (twelve) hours., Disp: , Rfl:  .  diltiazem (CARDIZEM) 30 MG tablet, Take 1 tablet (30 mg total) by mouth 3 (three) times daily as needed (For HR > 120). (Patient not taking: Reported on 04/28/2016), Disp: 60 tablet, Rfl: 1 .  glipiZIDE (GLUCOTROL) 10 MG tablet, Take 20 mg by mouth 2 (two) times daily., Disp: , Rfl:  .  insulin glargine (LANTUS) 100 UNIT/ML injection, Inject 20 Units into the skin every evening. , Disp: , Rfl:  .  levETIRAcetam (KEPPRA) 750 MG tablet, Take 1 tablet (750 mg total) by mouth 2 (two) times daily., Disp: 60 tablet, Rfl: 0 .  lisinopril (PRINIVIL,ZESTRIL) 20 MG tablet, Take 10 mg by mouth daily., Disp: , Rfl:  .  magnesium oxide (MAG-OX) 400 MG tablet, Take 400 mg by mouth daily., Disp: , Rfl:  .  simvastatin (ZOCOR) 20 MG tablet, Take 10 mg by mouth at bedtime., Disp: , Rfl:   Past Medical History: Past Medical History:  Diagnosis Date  . Atrial fibrillation ([place])    . Chronic systolic CHF (congestive heart failure) ([place])   . Diabetes mellitus without complication ([place])   . Hyperlipemia   . Hypertension     Tobacco Use: History  Smoking Status  . Former Smoker  Smokeless Tobacco  . Not on file    Labs: Recent Review Flowsheet Data    There is no flowsheet data to display.       Exercise Target Goals:    Exercise Program Goal: Individual exercise prescription set with THRR, safety & activity barriers. Participant demonstrates ability to understand and report RPE using BORG scale, to self-measure pulse accurately, and to acknowledge the importance of the exercise prescription.  Exercise Prescription Goal: Starting with aerobic activity 30 plus minutes a day, 3 days per week for initial exercise prescription. Provide home exercise prescription and guidelines that participant acknowledges understanding prior to discharge.  Activity Barriers & Risk Stratification:     Activity Barriers & Cardiac Risk Stratification - 04/28/16 1446      Activity Barriers & Cardiac Risk Stratification   Activity Barriers Back Problems;Deconditioning;Muscular Weakness;Shortness of Breath;Balance Concerns;History of Falls;Other (comment);Joint Problems   Comments Wears build up shoe on right side, also has neuropathy, both effect his balance at times, also has hip pain occasionally   Cardiac Risk Stratification High      6 Minute Walk:  [place] Name 04/28/16 1442 07/08/16 1718       6 Minute Walk   Phase Initial Discharge    Distance 700 feet 1000 feet    Distance % Change  - 43 %    Walk Time 5.43 minutes 6 minutes    # of Rest Breaks 3  2 sec, 26 sec, 6 sec 0    MPH 1.46 1.9    METS 1.96 2.6    RPE 14 12    Perceived Dyspnea  5  -    VO2 Peak 6.85 9.15    Symptoms Yes (comment) No    Comments tired, SOB, low back and hip pain  -    Resting HR 69 bpm 88 bpm    Resting BP 134/56 138/74    Max Ex. HR 90 bpm 95 bpm    Max  Ex. BP 154/84 142/70    2 Minute Post BP 146/74  -       Initial Exercise Prescription:     Initial Exercise Prescription - 04/28/16 1400      Date of Initial Exercise RX and Referring Provider   Date 04/28/16   Referring Provider [facility]     Treadmill   MPH 1   Grade 0   Minutes '15  5 5 5   '$ METs 1.77     NuStep   Level 1   Minutes 15   METs 1.7     Recumbant Elliptical   Level 1   RPM 40   Minutes 15   METs 1.7     Prescription Details   Frequency (times per week) 3   Duration Progress to 45 minutes of aerobic exercise without signs/symptoms of physical distress     Intensity   THRR 40-80% of Max Heartrate 102-135   Ratings of Perceived Exertion 11-15   Perceived Dyspnea 0-4     Progression   Progression Continue to progress workloads to maintain intensity without signs/symptoms of physical distress.     Resistance Training   Training Prescription Yes   Weight 2 lbs   Reps 10-15      Perform Capillary Blood Glucose checks as needed.  Exercise Prescription Changes:     Exercise Prescription Changes    Row Name 04/28/16 1400 05/14/16 1200 05/28/16 1100 06/03/16 1700 06/11/16 1200     Exercise Review   Progression  - Yes Yes  - Yes     Response to Exercise   Blood Pressure (Admit) 135/56 128/64 128/68  - 124/66   Blood Pressure (Exercise) 158/84 158/82 152/74  - 142/84   Blood Pressure (Exit) 146/74 128/64 128/62  - 126/60   Heart Rate (Admit) 69 bpm 80 bpm 88 bpm  - 84 bpm   Heart Rate (Exercise) 90 bpm 135 bpm 95 bpm  - 90 bpm   Heart Rate (Exit) 85 bpm 68 bpm 78 bpm  - 86 bpm   Oxygen Saturation (Exercise) 89 %  -  -  -  -   Rating of Perceived Exertion (Exercise) '15 13 11  '$ - 12   Perceived Dyspnea (Exercise) 5  -  -  -  -   Symptoms  - none none  - none   Duration  - Progress to 45 minutes of aerobic exercise without signs/symptoms of physical distress Progress to 45 minutes of aerobic exercise without signs/symptoms of physical  distress  - Progress to 45 minutes of aerobic exercise without signs/symptoms  of physical distress   Intensity  - THRR unchanged THRR unchanged  - THRR unchanged     Progression   Progression  -  - Continue to progress workloads to maintain intensity without signs/symptoms of physical distress.  - Continue to progress workloads to maintain intensity without signs/symptoms of physical distress.   Average METs  -  - 2.2  - 2.15     Resistance Training   Training Prescription  - Yes Yes  - Yes   Weight  - 2 2  - 4   Reps  - 10-15 10-15  - 10-15     Interval Training   Interval Training  - No No  -  -     Treadmill   MPH  - 1  -  - 1.5   Grade  - 0  -  - 0   Minutes  - 15  -  - 15   METs  - 1.77  -  - 2.15     NuStep   Level  - 1 1  - 4   Minutes  - 15 15  - 15   METs  - 1.7  -  -  -     Recumbant Elliptical   Level  -  - 3  -  -   Minutes  -  - 15  -  -     Home Exercise Plan   Plans to continue exercise at  -  -  - [place]  -   Frequency  -  -  - Add 1 additional day to program exercise sessions.  -   Row Name 06/25/16 1300 07/08/16 1400           Exercise Review   Progression Yes Yes        Response to Exercise   Blood Pressure (Admit) 138/70 142/82      Blood Pressure (Exercise) 184/78 164/82      Blood Pressure (Exit) 140/82 124/64      Heart Rate (Admit) 75 bpm 86 bpm      Heart Rate (Exercise) 101 bpm 100 bpm      Heart Rate (Exit) 84 bpm 84 bpm      Rating of Perceived Exertion (Exercise) 12 12      Symptoms none none      Duration Progress to 45 minutes of aerobic exercise without signs/symptoms of physical distress Progress to 45 minutes of aerobic exercise without signs/symptoms of physical distress      Intensity THRR unchanged THRR unchanged        Progression   Progression Continue to progress workloads to maintain intensity without signs/symptoms of physical distress. Continue to progress workloads to maintain intensity without signs/symptoms of  physical distress.      Average METs 2.28 2.75        Resistance Training   Training Prescription Yes Yes      Weight 4 4      Reps 10-15 10-15        Interval Training   Interval Training  - No        Treadmill   MPH 1.5  -      Grade 0  -      Minutes 15  -      METs 2.15  -        NuStep   Level 4 4      Minutes 15 15      METs 2.4 3.5  Recumbant Elliptical   Level  - 3      Minutes  - 15      METs  - 2         Exercise Comments:     Exercise Comments    Row Name 04/28/16 1448 05/14/16 1205 05/28/16 1141 06/03/16 1739 06/11/16 1242   Exercise Comments Exercise goals include breathing better and able to be more active. Darivs is progressing well with exercise. Ellen has progressed well with exercise. Home exercise discussed with Sonia Side today. HR RPE and outdoor exercise Garfield is progressing well with exercise.   Row Name 06/25/16 1350 07/08/16 1406         Exercise Comments Shaheed continues to progress well with exercise. Jahlani has progressed very well with exercise.         Discharge Exercise Prescription (Final Exercise Prescription Changes):     Exercise Prescription Changes - 07/08/16 1400      Exercise Review   Progression Yes     Response to Exercise   Blood Pressure (Admit) 142/82   Blood Pressure (Exercise) 164/82   Blood Pressure (Exit) 124/64   Heart Rate (Admit) 86 bpm   Heart Rate (Exercise) 100 bpm   Heart Rate (Exit) 84 bpm   Rating of Perceived Exertion (Exercise) 12   Symptoms none   Duration Progress to 45 minutes of aerobic exercise without signs/symptoms of physical distress   Intensity THRR unchanged     Progression   Progression Continue to progress workloads to maintain intensity without signs/symptoms of physical distress.   Average METs 2.75     Resistance Training   Training Prescription Yes   Weight 4   Reps 10-15     Interval Training   Interval Training No     NuStep   Level 4   Minutes 15   METs 3.5      Recumbant Elliptical   Level 3   Minutes 15   METs 2      Nutrition:  Target Goals: Understanding of nutrition guidelines, daily intake of sodium '1500mg'$ , cholesterol '200mg'$ , calories 30% from fat and 7% or less from saturated fats, daily to have 5 or more servings of fruits and vegetables.  Biometrics:     Pre Biometrics - 04/28/16 1445      Pre Biometrics   Height 6' 0.75" (1.848 m)   Weight 206 lb 8 oz (93.7 kg)   Waist Circumference 43.25 inches   Hip Circumference 41 inches   Waist to Hip Ratio 1.05 %   BMI (Calculated) 27.5   Single Leg Stand 4.41 seconds         Post Biometrics - 07/08/16 1717       Post  Biometrics   Height 6' 0.75" (1.848 m)   Waist Circumference 43 inches   Hip Circumference 39 inches   Waist to Hip Ratio 1.1 %      Nutrition Therapy Plan and Nutrition Goals:     Nutrition Therapy & Goals - 04/28/16 1515      Intervention Plan   Intervention Prescribe, educate and counsel regarding individualized specific dietary modifications aiming towards targeted core components such as weight, hypertension, lipid management, diabetes, heart failure and other comorbidities.   Expected Outcomes Short Term Goal: Understand basic principles of dietary content, such as calories, fat, sodium, cholesterol and nutrients.      Nutrition Discharge: Rate Your Plate Scores:     Nutrition Assessments - 06/22/16 1532      Rate  Your Plate Scores   Pre Score 68   Pre Score % 76 %   Post Score 72   Post Score % 80 %   % Change 4 %      Nutrition Goals Re-Evaluation:     Nutrition Goals Re-Evaluation    Row Name 05/07/16 1805 07/03/16 1232           Personal Goal #1 Re-Evaluation   Personal Goal #1 Heart healthy diet  -      Comments Bleu mentioned that he went to the nursing home about every day for 3 years when his wife was in the nursing home before she died. Rece said he got used to feeding her lunch then returning home and having his lunch  at 2pm. Holden has diabetes. Odyn said he doesn't feel the need to see the Cardiac Rehab Registered Dietician since he has been dealing with the diabetes for years and low sodium diet. Kj said his blood sugars normally run between 100-120.          Psychosocial: Target Goals: Acknowledge presence or absence of depression, maximize coping skills, provide positive support system. Participant is able to verbalize types and ability to use techniques and skills needed for reducing stress and depression.  Initial Review & Psychosocial Screening:     Initial Psych Review & Screening - 05/07/16 1804      Family Dynamics   Comments Jhonnie mentioned that he went to the nursing home about every day for 3 years when his wife was in the nursing home before she died. Raekwan said he got used to feeding her lunch then returning home and having his lunch at 2pm      Quality of Life Scores:     Quality of Life - 06/22/16 1532      Quality of Life Scores   Health/Function Pre 9.89 %   Health/Function Post 20 %   Health/Function % Change 102.22 %   Socioeconomic Pre 24.29 %   Socioeconomic Post 20.27 %   Socioeconomic % Change  -16.55 %   Psych/Spiritual Pre 20.71 %   Psych/Spiritual Post 19.43 %   Psych/Spiritual % Change -6.18 %   Family Pre 58.5 %   Family Post 21 %   Family % Change -64.1 %   GLOBAL Pre 17.73 %   GLOBAL Post 20 %   GLOBAL % Change 12.8 %      PHQ-9: Recent Review Flowsheet Data    Depression screen [facility] 2/9 06/22/2016 04/28/2016   Decreased Interest 2 1   Down, Depressed, Hopeless 1 1   PHQ - 2 Score 3 2   Altered sleeping 1 1   Tired, decreased energy 2 1   Change in appetite 2 1   Feeling bad or failure about yourself  0 0   Trouble concentrating 0 0   Moving slowly or fidgety/restless 0 0   Suicidal thoughts 0 0   PHQ-9 Score 8 5   Difficult doing work/chores Somewhat difficult Somewhat difficult      Psychosocial Evaluation and Intervention:      Psychosocial Evaluation - 05/25/16 1720      Psychosocial Evaluation & Interventions   Comments Counselor met with Mr. Vandunk today for initial psychosocial evaluation.  He is a 70 year old gentleman who has been in this program for several weeks now.  He reports already experiencing a great deal of progress with improved breathing; energy and mood since coming to Cardiac Rehab.  Mr.  Beitler reports his spouse passed away about a year ago and he has been in and out of the hospital since that time with strokes and other health problems.  He states his doctors changed his diuretics just prior to his coming here and that has made a huge difference as he has lost 12 pounds and having less fluid helps him feel better overall.  Mr. Cantera is optimistic at this time that he will continue to improve during the remainder of this program.  Counselor will continue to follow with him.        Psychosocial Re-Evaluation:     Psychosocial Re-Evaluation    [place] Name 07/03/16 1226 07/03/16 1235           Psychosocial Re-Evaluation   Interventions Encouraged to attend Cardiac Rehabilitation for the exercise  -      Comments I asked Tobey about his wife death and they were married 71 years. She was 70 when she got sick and 8 years of dealing with her sickness and last 3 years in a nursing home. "I just couldn't take care of her anymore". Sometimes like the Cardiac rehab class where the other person was talking about his family member death. I couldn't attend the next time in Cardiac REhab but some days are better than others.  Jedaiah said financially he finally got approved for 100% [place] disability vs 20% so his income checks have gone up so less stress on him.          Vocational Rehabilitation: Provide vocational rehab assistance to qualifying candidates.   Vocational Rehab Evaluation & Intervention:     Vocational Rehab - 04/28/16 1525      Initial Vocational Rehab Evaluation & Intervention   Assessment shows  need for Vocational Rehabilitation No      Education: Education Goals: Education classes will be provided on a weekly basis, covering required topics. Participant will state understanding/return demonstration of topics presented.  Learning Barriers/Preferences:     Learning Barriers/Preferences - 04/28/16 1525      Learning Barriers/Preferences   Learning Barriers None   Learning Preferences None      Education Topics: General Nutrition Guidelines/Fats and Fiber: -Group instruction provided by verbal, written material, models and posters to present the general guidelines for heart healthy nutrition. Gives an explanation and review of dietary fats and fiber. Flowsheet Row Cardiac Rehab from 07/06/2016 in [facility] Cardiac and Pulmonary Rehab  Date  05/25/16  Educator  PI  Instruction Review Code  2- meets goals/outcomes      Controlling Sodium/Reading Food Labels: -Group verbal and written material supporting the discussion of sodium use in heart healthy nutrition. Review and explanation with models, verbal and written materials for utilization of the food label. Flowsheet Row Cardiac Rehab from 07/06/2016 in [facility] Cardiac and Pulmonary Rehab  Date  06/01/16  Educator  PI  Instruction Review Code  2- meets goals/outcomes      Exercise Physiology & Risk Factors: - Group verbal and written instruction with models to review the exercise physiology of the cardiovascular system and associated critical values. Details cardiovascular disease risk factors and the goals associated with each risk factor. Flowsheet Row Cardiac Rehab from 07/06/2016 in [facility] Cardiac and Pulmonary Rehab  Date  06/08/16  Educator  [facility]  Instruction Review Code  2- meets goals/outcomes      Aerobic Exercise & Resistance Training: - Gives group verbal and written discussion on the health impact of inactivity. On the components of aerobic and  resistive training programs and the benefits of this training and how  to safely progress through these programs. Flowsheet Row Cardiac Rehab from 07/06/2016 in [facility] Cardiac and Pulmonary Rehab  Date  06/10/16  Educator  AS  Instruction Review Code  2- meets goals/outcomes      Flexibility, Balance, General Exercise Guidelines: - Provides group verbal and written instruction on the benefits of flexibility and balance training programs. Provides general exercise guidelines with specific guidelines to those with heart or lung disease. Demonstration and skill practice provided. Flowsheet Row Cardiac Rehab from 07/06/2016 in [facility] Cardiac and Pulmonary Rehab  Date  06/15/16  Educator  [facility]  Instruction Review Code  2- meets goals/outcomes      Stress Management: - Provides group verbal and written instruction about the health risks of elevated stress, cause of high stress, and healthy ways to reduce stress. Flowsheet Row Cardiac Rehab from 07/06/2016 in [facility] Cardiac and Pulmonary Rehab  Date  06/24/16  Educator  kc  Instruction Review Code  2- meets goals/outcomes      Depression: - Provides group verbal and written instruction on the correlation between heart/lung disease and depressed mood, treatment options, and the stigmas associated with seeking treatment. Flowsheet Row Cardiac Rehab from 07/06/2016 in [facility] Cardiac and Pulmonary Rehab  Date  05/27/16  Educator  [facility]  Instruction Review Code  2- meets goals/outcomes      Anatomy & Physiology of the Heart: - Group verbal and written instruction and models provide basic cardiac anatomy and physiology, with the coronary electrical and arterial systems. Review of: AMI, Angina, Valve disease, Heart Failure, Cardiac Arrhythmia, Pacemakers, and the ICD. Flowsheet Row Cardiac Rehab from 07/06/2016 in [facility] Cardiac and Pulmonary Rehab  Date  06/22/16  Educator  CE  Instruction Review Code  2- meets goals/outcomes      Cardiac Procedures: - Group verbal and written instruction and models to describe the  testing methods done to diagnose heart disease. Reviews the outcomes of the test results. Describes the treatment choices: Medical Management, Angioplasty, or Coronary Bypass Surgery.   Cardiac Medications: - Group verbal and written instruction to review commonly prescribed medications for heart disease. Reviews the medication, class of the drug, and side effects. Includes the steps to properly store meds and maintain the prescription regimen. Flowsheet Row Cardiac Rehab from 07/06/2016 in [facility] Cardiac and Pulmonary Rehab  Date  07/06/16 Marisue Humble 1]  Educator  SB  Instruction Review Code  2- meets goals/outcomes      Go Sex-Intimacy & Heart Disease, Get SMART - Goal Setting: - Group verbal and written instruction through game format to discuss heart disease and the return to sexual intimacy. Provides group verbal and written material to discuss and apply goal setting through the application of the S.M.A.R.T. Method.   Other Matters of the Heart: - Provides group verbal, written materials and models to describe Heart Failure, Angina, Valve Disease, and Diabetes in the realm of heart disease. Includes description of the disease process and treatment options available to the cardiac patient. Flowsheet Row Cardiac Rehab from 07/06/2016 in [facility] Cardiac and Pulmonary Rehab  Date  06/22/16  Educator  CE  Instruction Review Code  2- meets goals/outcomes      Exercise & Equipment Safety: - Individual verbal instruction and demonstration of equipment use and safety with use of the equipment. Flowsheet Row Cardiac Rehab from 07/06/2016 in [facility] Cardiac and Pulmonary Rehab  Date  04/28/16  Educator  SB  Instruction Review Code  2- meets goals/outcomes      Infection Prevention: - Provides verbal and written material to individual with discussion of infection control including proper hand washing and proper equipment cleaning during exercise session. Flowsheet Row Cardiac Rehab from 07/06/2016 in  [facility] Cardiac and Pulmonary Rehab  Date  04/28/16  Educator  SB  Instruction Review Code  2- meets goals/outcomes      Falls Prevention: - Provides verbal and written material to individual with discussion of falls prevention and safety.   Diabetes: - Individual verbal and written instruction to review signs/symptoms of diabetes, desired ranges of glucose level fasting, after meals and with exercise. Advice that pre and post exercise glucose checks will be done for 3 sessions at entry of program. Flowsheet Row Cardiac Rehab from 07/06/2016 in [facility] Cardiac and Pulmonary Rehab  Date  04/28/16  Educator  SB  Instruction Review Code  2- meets goals/outcomes       Knowledge Questionnaire Score:     Knowledge Questionnaire Score - 06/22/16 1534      Knowledge Questionnaire Score   Pre Score 24/28   Post Score 28/28      Core Components/Risk Factors/Patient Goals at Admission:     Personal Goals and Risk Factors at Admission - 04/28/16 1330      Core Components/Risk Factors/Patient Goals on Admission    Weight Management Obesity;Weight Maintenance;Yes   Intervention Weight Management: Develop a combined nutrition and exercise program designed to reach desired caloric intake, while maintaining appropriate intake of nutrient and fiber, sodium and fats, and appropriate energy expenditure required for the weight goal.;Weight Management: Provide education and appropriate resources to help participant work on and attain dietary goals.;Weight Management/Obesity: Establish reasonable short term and long term weight goals.;Obesity: Provide education and appropriate resources to help participant work on and attain dietary goals.   Admit Weight 202 lb (91.6 kg)   Goal Weight: Short Term 197 lb (89.4 kg)   Goal Weight: Long Term 180 lb (81.6 kg)   Expected Outcomes Short Term: Continue to assess and modify interventions until short term weight is achieved;Long Term: Adherence to nutrition and  physical activity/exercise program aimed toward attainment of established weight goal   Sedentary Yes   Intervention Provide advice, education, support and counseling about physical activity/exercise needs.;Develop an individualized exercise prescription for aerobic and resistive training based on initial evaluation findings, risk stratification, comorbidities and participant's personal goals.   Expected Outcomes Achievement of increased cardiorespiratory fitness and enhanced flexibility, muscular endurance and strength shown through measurements of functional capacity and personal statement of participant.   Increase Strength and Stamina Yes   Intervention Provide advice, education, support and counseling about physical activity/exercise needs.;Develop an individualized exercise prescription for aerobic and resistive training based on initial evaluation findings, risk stratification, comorbidities and participant's personal goals.   Expected Outcomes Achievement of increased cardiorespiratory fitness and enhanced flexibility, muscular endurance and strength shown through measurements of functional capacity and personal statement of participant.   Improve shortness of breath with ADL's Yes   Intervention Provide education, individualized exercise plan and daily activity instruction to help decrease symptoms of SOB with activities of daily living.   Expected Outcomes Short Term: Achieves a reduction of symptoms when performing activities of daily living.   Diabetes Yes  1990 diagnosed.  Insulin started one year ago.    Intervention Provide education about signs/symptoms and action to take for hypo/hyperglycemia.;Provide education about proper nutrition, including hydration, and aerobic/resistive exercise prescription along with prescribed medications to achieve  blood glucose in normal ranges: Fasting glucose 65-99 mg/dL   Expected Outcomes Short Term: Participant verbalizes understanding of the  signs/symptoms and immediate care of hyper/hypoglycemia, proper foot care and importance of medication, aerobic/resistive exercise and nutrition plan for blood glucose control.;Long Term: Attainment of HbA1C < 7%.   Heart Failure Yes   Intervention Provide a combined exercise and nutrition program that is supplemented with education, support and counseling about heart failure. Directed toward relieving symptoms such as shortness of breath, decreased exercise tolerance, and extremity edema.   Expected Outcomes Improve functional capacity of life   Hypertension Yes   Intervention Provide education on lifestyle modifcations including regular physical activity/exercise, weight management, moderate sodium restriction and increased consumption of fresh fruit, vegetables, and low fat dairy, alcohol moderation, and smoking cessation.;Monitor prescription use compliance.   Expected Outcomes Short Term: Continued assessment and intervention until BP is < 140/15m HG in hypertensive participants. < 130/839mHG in hypertensive participants with diabetes, heart failure or chronic kidney disease.;Long Term: Maintenance of blood pressure at goal levels.   Lipids Yes   Intervention Provide education and support for participant on nutrition & aerobic/resistive exercise along with prescribed medications to achieve LDL '70mg'$ , HDL >'40mg'$ .   Expected Outcomes Short Term: Participant states understanding of desired cholesterol values and is compliant with medications prescribed. Participant is following exercise prescription and nutrition guidelines.;Long Term: Cholesterol controlled with medications as prescribed, with individualized exercise RX and with personalized nutrition plan. Value goals: LDL < '70mg'$ , HDL > 40 mg.      Core Components/Risk Factors/Patient Goals Review:      Goals and Risk Factor Review    Row Name 05/18/16 1802 06/01/16 1744 07/03/16 1230         Core Components/Risk Factors/Patient Goals  Review   Personal Goals Review Weight Management/Obesity;Diabetes;Lipids;Heart Failure Weight Management/Obesity;Improve shortness of breath with ADL's  -     Review Spoke with JeLogynoday about his goals.   JeDiontaas seen weight drop seveal poundds and is not seeing fluid retention of 2-3 pound flucuation since has changed diurectic. Diabetes is staying well controlled along with cholesterol and heart failure symptoms JeUlyseports he is down to 192 lbs with his clothes on. Has met his short term goal for weight management.  Shortness of breath symptoms are improvng with fluid loss and exercise routine JeCabellaid his weight has been up a couple of lbs so went back to taking his diuretic. One extra diuretic bothered his GI tract.      Expected Outcomes Continue with weight loss following exercise and nutrition plans. Maintian control of heart failure symptoms, cholesterol levels, through prescribed meds. exercise and nutrition plans. Continue with weight loss following exercise and nutrition . Continued maintainence of fluid levels to aid in decreasing SOB Cont heart healthy lifestyle and cont his diabetes.        Core Components/Risk Factors/Patient Goals at Discharge (Final Review):      Goals and Risk Factor Review - 07/03/16 1230      Core Components/Risk Factors/Patient Goals Review   Review JeJsiahaid his weight has been up a couple of lbs so went back to taking his diuretic. One extra diuretic bothered his GI tract.    Expected Outcomes Cont heart healthy lifestyle and cont his diabetes.      ITP Comments:     ITP Comments    Row Name 04/28/16 1509 05/14/16 1205 05/20/16 0716 06/03/16 1739 06/17/16 1112   ITP Comments Initial ITP created  today during medical review.  Diagnosis documentation notes sent for scan into [facility]. [place] is progressing well with exercise. 30 day review. Continue with ITP unless changes noted by Medical Director at signature of review. Home exercise discussed with  Sonia Side today. HR RPE and outdoor exercise 30 day review. Continue with ITP unless changes noted by Medical Director at signature of review.      Comments: Discharge ITP

## 2016-07-10 NOTE — Progress Notes (Deleted)
Discharge Summary  Patient Details  Name: Victor [name] MRN: [id number] Date of Birth: 07-Feb-1946 Referring Provider:   Flowsheet Row Cardiac Rehab from 04/28/2016 in [facility]  Referring Provider  [facility]       Number of Visits: ***  Reason for Discharge:  Patient reached a stable level of exercise. Patient independent in their exercise.  Smoking History:  History  Smoking Status  . Former Smoker  Smokeless Tobacco  . Not on file    Diagnosis:  No diagnosis found.  ADL UCSD:   Initial Exercise Prescription:     Initial Exercise Prescription - 04/28/16 1400      Date of Initial Exercise RX and Referring Provider   Date 04/28/16   Referring Provider [facility]     Treadmill   MPH 1   Grade 0   Minutes '15  5 5 5   '$ METs 1.77     NuStep   Level 1   Minutes 15   METs 1.7     Recumbant Elliptical   Level 1   RPM 40   Minutes 15   METs 1.7     Prescription Details   Frequency (times per week) 3   Duration Progress to 45 minutes of aerobic exercise without signs/symptoms of physical distress     Intensity   THRR 40-80% of Max Heartrate 102-135   Ratings of Perceived Exertion 11-15   Perceived Dyspnea 0-4     Progression   Progression Continue to progress workloads to maintain intensity without signs/symptoms of physical distress.     Resistance Training   Training Prescription Yes   Weight 2 lbs   Reps 10-15      Discharge Exercise Prescription (Final Exercise Prescription Changes):     Exercise Prescription Changes - 07/08/16 1400      Exercise Review   Progression Yes     Response to Exercise   Blood Pressure (Admit) 142/82   Blood Pressure (Exercise) 164/82   Blood Pressure (Exit) 124/64   Heart Rate (Admit) 86 bpm   Heart Rate (Exercise) 100 bpm   Heart Rate (Exit) 84 bpm   Rating of Perceived Exertion (Exercise) 12   Symptoms none   Duration Progress to 45 minutes of aerobic exercise  without signs/symptoms of physical distress   Intensity THRR unchanged     Progression   Progression Continue to progress workloads to maintain intensity without signs/symptoms of physical distress.   Average METs 2.75     Resistance Training   Training Prescription Yes   Weight 4   Reps 10-15     Interval Training   Interval Training No     NuStep   Level 4   Minutes 15   METs 3.5     Recumbant Elliptical   Level 3   Minutes 15   METs 2      Functional Capacity:     6 Minute Walk    Row Name 04/28/16 1442 07/08/16 1718       6 Minute Walk   Phase Initial Discharge    Distance 700 feet 1000 feet    Distance % Change  - 43 %    Walk Time 5.43 minutes 6 minutes    # of Rest Breaks 3  2 sec, 26 sec, 6 sec 0    MPH 1.46 1.9    METS 1.96 2.6    RPE 14 12    Perceived Dyspnea  5  -  VO2 Peak 6.85 9.15    Symptoms Yes (comment) No    Comments tired, SOB, low back and hip pain  -    Resting HR 69 bpm 88 bpm    Resting BP 134/56 138/74    Max Ex. HR 90 bpm 95 bpm    Max Ex. BP 154/84 142/70    2 Minute Post BP 146/74  -       Psychological, QOL, Others - Outcomes: PHQ 2/9: Depression screen [facility] 2/9 06/22/2016 04/28/2016  Decreased Interest 2 1  Down, Depressed, Hopeless 1 1  PHQ - 2 Score 3 2  Altered sleeping 1 1  Tired, decreased energy 2 1  Change in appetite 2 1  Feeling bad or failure about yourself  0 0  Trouble concentrating 0 0  Moving slowly or fidgety/restless 0 0  Suicidal thoughts 0 0  PHQ-9 Score 8 5  Difficult doing work/chores Somewhat difficult Somewhat difficult    Quality of Life:     Quality of Life - 06/22/16 1532      Quality of Life Scores   Health/Function Pre 9.89 %   Health/Function Post 20 %   Health/Function % Change 102.22 %   Socioeconomic Pre 24.29 %   Socioeconomic Post 20.27 %   Socioeconomic % Change  -16.55 %   Psych/Spiritual Pre 20.71 %   Psych/Spiritual Post 19.43 %   Psych/Spiritual % Change -6.18 %    Family Pre 58.5 %   Family Post 21 %   Family % Change -64.1 %   GLOBAL Pre 17.73 %   GLOBAL Post 20 %   GLOBAL % Change 12.8 %      Personal Goals: Goals established at orientation with interventions provided to work toward goal.     Personal Goals and Risk Factors at Admission - 04/28/16 1330      Core Components/Risk Factors/Patient Goals on Admission    Weight Management Obesity;Weight Maintenance;Yes   Intervention Weight Management: Develop a combined nutrition and exercise program designed to reach desired caloric intake, while maintaining appropriate intake of nutrient and fiber, sodium and fats, and appropriate energy expenditure required for the weight goal.;Weight Management: Provide education and appropriate resources to help participant work on and attain dietary goals.;Weight Management/Obesity: Establish reasonable short term and long term weight goals.;Obesity: Provide education and appropriate resources to help participant work on and attain dietary goals.   Admit Weight 202 lb (91.6 kg)   Goal Weight: Short Term 197 lb (89.4 kg)   Goal Weight: Long Term 180 lb (81.6 kg)   Expected Outcomes Short Term: Continue to assess and modify interventions until short term weight is achieved;Long Term: Adherence to nutrition and physical activity/exercise program aimed toward attainment of established weight goal   Sedentary Yes   Intervention Provide advice, education, support and counseling about physical activity/exercise needs.;Develop an individualized exercise prescription for aerobic and resistive training based on initial evaluation findings, risk stratification, comorbidities and participant's personal goals.   Expected Outcomes Achievement of increased cardiorespiratory fitness and enhanced flexibility, muscular endurance and strength shown through measurements of functional capacity and personal statement of participant.   Increase Strength and Stamina Yes   Intervention  Provide advice, education, support and counseling about physical activity/exercise needs.;Develop an individualized exercise prescription for aerobic and resistive training based on initial evaluation findings, risk stratification, comorbidities and participant's personal goals.   Expected Outcomes Achievement of increased cardiorespiratory fitness and enhanced flexibility, muscular endurance and strength shown through measurements of functional  capacity and personal statement of participant.   Improve shortness of breath with ADL's Yes   Intervention Provide education, individualized exercise plan and daily activity instruction to help decrease symptoms of SOB with activities of daily living.   Expected Outcomes Short Term: Achieves a reduction of symptoms when performing activities of daily living.   Diabetes Yes  1990 diagnosed.  Insulin started one year ago.    Intervention Provide education about signs/symptoms and action to take for hypo/hyperglycemia.;Provide education about proper nutrition, including hydration, and aerobic/resistive exercise prescription along with prescribed medications to achieve blood glucose in normal ranges: Fasting glucose 65-99 mg/dL   Expected Outcomes Short Term: Participant verbalizes understanding of the signs/symptoms and immediate care of hyper/hypoglycemia, proper foot care and importance of medication, aerobic/resistive exercise and nutrition plan for blood glucose control.;Long Term: Attainment of HbA1C < 7%.   Heart Failure Yes   Intervention Provide a combined exercise and nutrition program that is supplemented with education, support and counseling about heart failure. Directed toward relieving symptoms such as shortness of breath, decreased exercise tolerance, and extremity edema.   Expected Outcomes Improve functional capacity of life   Hypertension Yes   Intervention Provide education on lifestyle modifcations including regular physical activity/exercise,  weight management, moderate sodium restriction and increased consumption of fresh fruit, vegetables, and low fat dairy, alcohol moderation, and smoking cessation.;Monitor prescription use compliance.   Expected Outcomes Short Term: Continued assessment and intervention until BP is < 140/9m HG in hypertensive participants. < 130/856mHG in hypertensive participants with diabetes, heart failure or chronic kidney disease.;Long Term: Maintenance of blood pressure at goal levels.   Lipids Yes   Intervention Provide education and support for participant on nutrition & aerobic/resistive exercise along with prescribed medications to achieve LDL '70mg'$ , HDL >'40mg'$ .   Expected Outcomes Short Term: Participant states understanding of desired cholesterol values and is compliant with medications prescribed. Participant is following exercise prescription and nutrition guidelines.;Long Term: Cholesterol controlled with medications as prescribed, with individualized exercise RX and with personalized nutrition plan. Value goals: LDL < '70mg'$ , HDL > 40 mg.       Personal Goals Discharge:     Goals and Risk Factor Review    Row Name 05/18/16 1802 06/01/16 1744 07/03/16 1230         Core Components/Risk Factors/Patient Goals Review   Personal Goals Review Weight Management/Obesity;Diabetes;Lipids;Heart Failure Weight Management/Obesity;Improve shortness of breath with ADL's  -     Review Spoke with Victor [name] about his goals.   Victor [name] seen weight drop seveal poundds and is not seeing fluid retention of 2-3 pound flucuation since has changed diurectic. Diabetes is staying well controlled along with cholesterol and heart failure symptoms Victor [name] he is down to 192 lbs with his clothes on. Has met his short term goal for weight management.  Shortness of breath symptoms are improvng with fluid loss and exercise routine Victor [name] his weight has been up a couple of lbs so went back to taking his diuretic. One extra  diuretic bothered his GI tract.      Expected Outcomes Continue with weight loss following exercise and nutrition plans. Maintian control of heart failure symptoms, cholesterol levels, through prescribed meds. exercise and nutrition plans. Continue with weight loss following exercise and nutrition . Continued maintainence of fluid levels to aid in decreasing SOB Cont heart healthy lifestyle and cont his diabetes.        Nutrition & Weight - Outcomes:     Pre Biometrics -  04/28/16 1445      Pre Biometrics   Height 6' 0.75" (1.848 m)   Weight 206 lb 8 oz (93.7 kg)   Waist Circumference 43.25 inches   Hip Circumference 41 inches   Waist to Hip Ratio 1.05 %   BMI (Calculated) 27.5   Single Leg Stand 4.41 seconds         Post Biometrics - 07/08/16 1717       Post  Biometrics   Height 6' 0.75" (1.848 m)   Waist Circumference 43 inches   Hip Circumference 39 inches   Waist to Hip Ratio 1.1 %      Nutrition:     Nutrition Therapy & Goals - 04/28/16 1515      Intervention Plan   Intervention Prescribe, educate and counsel regarding individualized specific dietary modifications aiming towards targeted core components such as weight, hypertension, lipid management, diabetes, heart failure and other comorbidities.   Expected Outcomes Short Term Goal: Understand basic principles of dietary content, such as calories, fat, sodium, cholesterol and nutrients.      Nutrition Discharge:     Nutrition Assessments - 06/22/16 1532      Rate Your Plate Scores   Pre Score 68   Pre Score % 76 %   Post Score 72   Post Score % 80 %   % Change 4 %      Education Questionnaire Score:     Knowledge Questionnaire Score - 06/22/16 1534      Knowledge Questionnaire Score   Pre Score 24/28   Post Score 28/28      Goals reviewed with patient; copy given to patient.

## 2016-07-15 NOTE — Progress Notes (Signed)
Cardiac Individual Treatment Plan  Patient Details  Name: Victor [name] MRN: [id number] Date of Birth: [birth year] Referring Provider:   Flowsheet Row Cardiac Rehab from 04/28/2016 in [facility] Cardiac and Pulmonary Rehab  Referring Provider  [facility]      Initial Encounter Date:  [place] from 04/28/2016 in [facility] Cardiac and Pulmonary Rehab  Date  04/28/16  Referring Provider  [facility]      Visit Diagnosis: Heart failure, diastolic, chronic ([place])  Patient's Home Medications on Admission:  Current Outpatient Prescriptions:  .  allopurinol (ZYLOPRIM) 100 MG tablet, Take 200 mg by mouth daily., Disp: , Rfl:  .  amLODipine (NORVASC) 10 MG tablet, Take 10 mg by mouth daily., Disp: , Rfl:  .  ammonium lactate (AMLACTIN) 12 % cream, Apply 1 g topically as needed for dry skin. Reported on 10/24/2015, Disp: , Rfl:  .  apixaban (ELIQUIS) 5 MG TABS tablet, Take 5 mg by mouth 2 (two) times daily., Disp: , Rfl:  .  carvedilol (COREG) 25 MG tablet, Take 25 mg by mouth every 12 (twelve) hours., Disp: , Rfl:  .  diltiazem (CARDIZEM) 30 MG tablet, Take 1 tablet (30 mg total) by mouth 3 (three) times daily as needed (For HR > 120). (Patient not taking: Reported on 04/28/2016), Disp: 60 tablet, Rfl: 1 .  glipiZIDE (GLUCOTROL) 10 MG tablet, Take 20 mg by mouth 2 (two) times daily., Disp: , Rfl:  .  insulin glargine (LANTUS) 100 UNIT/ML injection, Inject 20 Units into the skin every evening. , Disp: , Rfl:  .  levETIRAcetam (KEPPRA) 750 MG tablet, Take 1 tablet (750 mg total) by mouth 2 (two) times daily., Disp: 60 tablet, Rfl: 0 .  lisinopril (PRINIVIL,ZESTRIL) 20 MG tablet, Take 10 mg by mouth daily., Disp: , Rfl:  .  magnesium oxide (MAG-OX) 400 MG tablet, Take 400 mg by mouth daily., Disp: , Rfl:  .  simvastatin (ZOCOR) 20 MG tablet, Take 10 mg by mouth at bedtime., Disp: , Rfl:   Past Medical History: Past Medical History:  Diagnosis Date  . Atrial fibrillation ([place])    . Chronic systolic CHF (congestive heart failure) ([place])   . Diabetes mellitus without complication ([place])   . Hyperlipemia   . Hypertension     Tobacco Use: History  Smoking Status  . Former Smoker  Smokeless Tobacco  . Not on file    Labs: Recent Review Flowsheet Data    There is no flowsheet data to display.       Exercise Target Goals:    Exercise Program Goal: Individual exercise prescription set with THRR, safety & activity barriers. Participant demonstrates ability to understand and report RPE using BORG scale, to self-measure pulse accurately, and to acknowledge the importance of the exercise prescription.  Exercise Prescription Goal: Starting with aerobic activity 30 plus minutes a day, 3 days per week for initial exercise prescription. Provide home exercise prescription and guidelines that participant acknowledges understanding prior to discharge.  Activity Barriers & Risk Stratification:     Activity Barriers & Cardiac Risk Stratification - 04/28/16 1446      Activity Barriers & Cardiac Risk Stratification   Activity Barriers Back Problems;Deconditioning;Muscular Weakness;Shortness of Breath;Balance Concerns;History of Falls;Other (comment);Joint Problems   Comments Wears build up shoe on right [name], also has neuropathy, both effect his balance at times, also has hip pain occasionally   Cardiac Risk Stratification High      6 Minute Walk:  Victor [name] Name 04/28/16 1442 07/08/16 1718       6 Minute Walk   Phase Initial Discharge    Distance 700 feet 1000 feet    Distance % Change  - 43 %    Walk Time 5.43 minutes 6 minutes    # of Rest Breaks 3  2 sec, 26 sec, 6 sec 0    MPH 1.46 1.9    METS 1.96 2.6    RPE 14 12    Perceived Dyspnea  5  -    VO2 Peak 6.85 9.15    Symptoms Yes (comment) No    Comments tired, SOB, low back and hip pain  -    Resting HR 69 bpm 88 bpm    Resting BP 134/56 138/74    Max Ex. HR 90 bpm 95 bpm    Max  Ex. BP 154/84 142/70    2 Minute Post BP 146/74  -       Initial Exercise Prescription:     Initial Exercise Prescription - 04/28/16 1400      Date of Initial Exercise RX and Referring Provider   Date 04/28/16   Referring Provider [facility]     Treadmill   MPH 1   Grade 0   Minutes '15  5 5 5   '$ METs 1.77     NuStep   Level 1   Minutes 15   METs 1.7     Recumbant Elliptical   Level 1   RPM 40   Minutes 15   METs 1.7     Prescription Details   Frequency (times per week) 3   Duration Progress to 45 minutes of aerobic exercise without signs/symptoms of physical distress     Intensity   THRR 40-80% of Max Heartrate 102-135   Ratings of Perceived Exertion 11-15   Perceived Dyspnea 0-4     Progression   Progression Continue to progress workloads to maintain intensity without signs/symptoms of physical distress.     Resistance Training   Training Prescription Yes   Weight 2 lbs   Reps 10-15      Perform Capillary Blood Glucose checks as needed.  Exercise Prescription Changes:     Exercise Prescription Changes    Row Name 04/28/16 1400 05/14/16 1200 05/28/16 1100 06/03/16 1700 06/11/16 1200     Exercise Review   Progression  - Yes Yes  - Yes     Response to Exercise   Blood Pressure (Admit) 135/56 128/64 128/68  - 124/66   Blood Pressure (Exercise) 158/84 158/82 152/74  - 142/84   Blood Pressure (Exit) 146/74 128/64 128/62  - 126/60   Heart Rate (Admit) 69 bpm 80 bpm 88 bpm  - 84 bpm   Heart Rate (Exercise) 90 bpm 135 bpm 95 bpm  - 90 bpm   Heart Rate (Exit) 85 bpm 68 bpm 78 bpm  - 86 bpm   Oxygen Saturation (Exercise) 89 %  -  -  -  -   Rating of Perceived Exertion (Exercise) '15 13 11  '$ - 12   Perceived Dyspnea (Exercise) 5  -  -  -  -   Symptoms  - none none  - none   Duration  - Progress to 45 minutes of aerobic exercise without signs/symptoms of physical distress Progress to 45 minutes of aerobic exercise without signs/symptoms of physical  distress  - Progress to 45 minutes of aerobic exercise without signs/symptoms  of physical distress   Intensity  - THRR unchanged THRR unchanged  - THRR unchanged     Progression   Progression  -  - Continue to progress workloads to maintain intensity without signs/symptoms of physical distress.  - Continue to progress workloads to maintain intensity without signs/symptoms of physical distress.   Average METs  -  - 2.2  - 2.15     Resistance Training   Training Prescription  - Yes Yes  - Yes   Weight  - 2 2  - 4   Reps  - 10-15 10-15  - 10-15     Interval Training   Interval Training  - No No  -  -     Treadmill   MPH  - 1  -  - 1.5   Grade  - 0  -  - 0   Minutes  - 15  -  - 15   METs  - 1.77  -  - 2.15     NuStep   Level  - 1 1  - 4   Minutes  - 15 15  - 15   METs  - 1.7  -  -  -     Recumbant Elliptical   Level  -  - 3  -  -   Minutes  -  - 15  -  -     Home Exercise Plan   Plans to continue exercise at  -  -  - [place]  -   Frequency  -  -  - Add 1 additional day to program exercise sessions.  -   Row Name 06/25/16 1300 07/08/16 1400           Exercise Review   Progression Yes Yes        Response to Exercise   Blood Pressure (Admit) 138/70 142/82      Blood Pressure (Exercise) 184/78 164/82      Blood Pressure (Exit) 140/82 124/64      Heart Rate (Admit) 75 bpm 86 bpm      Heart Rate (Exercise) 101 bpm 100 bpm      Heart Rate (Exit) 84 bpm 84 bpm      Rating of Perceived Exertion (Exercise) 12 12      Symptoms none none      Duration Progress to 45 minutes of aerobic exercise without signs/symptoms of physical distress Progress to 45 minutes of aerobic exercise without signs/symptoms of physical distress      Intensity THRR unchanged THRR unchanged        Progression   Progression Continue to progress workloads to maintain intensity without signs/symptoms of physical distress. Continue to progress workloads to maintain intensity without signs/symptoms of  physical distress.      Average METs 2.28 2.75        Resistance Training   Training Prescription Yes Yes      Weight 4 4      Reps 10-15 10-15        Interval Training   Interval Training  - No        Treadmill   MPH 1.5  -      Grade 0  -      Minutes 15  -      METs 2.15  -        NuStep   Level 4 4      Minutes 15 15      METs 2.4 3.5  Recumbant Elliptical   Level  - 3      Minutes  - 15      METs  - 2         Exercise Comments:     Exercise Comments    Row Name 04/28/16 1448 05/14/16 1205 05/28/16 1141 06/03/16 1739 06/11/16 1242   Exercise Comments Exercise goals include breathing better and able to be more active. Victor [name] is progressing well with exercise. Victor [name] has progressed well with exercise. Home exercise discussed with Victor [name] today. HR RPE and outdoor exercise Victor [name] is progressing well with exercise.   Row Name 06/25/16 1350 07/08/16 1406         Exercise Comments Victor [name] continues to progress well with exercise. Victor [name] has progressed very well with exercise.         Discharge Exercise Prescription (Final Exercise Prescription Changes):     Exercise Prescription Changes - 07/08/16 1400      Exercise Review   Progression Yes     Response to Exercise   Blood Pressure (Admit) 142/82   Blood Pressure (Exercise) 164/82   Blood Pressure (Exit) 124/64   Heart Rate (Admit) 86 bpm   Heart Rate (Exercise) 100 bpm   Heart Rate (Exit) 84 bpm   Rating of Perceived Exertion (Exercise) 12   Symptoms none   Duration Progress to 45 minutes of aerobic exercise without signs/symptoms of physical distress   Intensity THRR unchanged     Progression   Progression Continue to progress workloads to maintain intensity without signs/symptoms of physical distress.   Average METs 2.75     Resistance Training   Training Prescription Yes   Weight 4   Reps 10-15     Interval Training   Interval Training No     NuStep   Level 4   Minutes 15   METs 3.5      Recumbant Elliptical   Level 3   Minutes 15   METs 2      Nutrition:  Target Goals: Understanding of nutrition guidelines, daily intake of sodium '1500mg'$ , cholesterol '200mg'$ , calories 30% from fat and 7% or less from saturated fats, daily to have 5 or more servings of fruits and vegetables.  Biometrics:     Pre Biometrics - 04/28/16 1445      Pre Biometrics   Height 6' 0.75" (1.848 m)   Weight 206 lb 8 oz (93.7 kg)   Waist Circumference 43.25 inches   Hip Circumference 41 inches   Waist to Hip Ratio 1.05 %   BMI (Calculated) 27.5   Single Leg Stand 4.41 seconds         Post Biometrics - 07/08/16 1717       Post  Biometrics   Height 6' 0.75" (1.848 m)   Waist Circumference 43 inches   Hip Circumference 39 inches   Waist to Hip Ratio 1.1 %      Nutrition Therapy Plan and Nutrition Goals:     Nutrition Therapy & Goals - 04/28/16 1515      Intervention Plan   Intervention Prescribe, educate and counsel regarding individualized specific dietary modifications aiming towards targeted core components such as weight, hypertension, lipid management, diabetes, heart failure and other comorbidities.   Expected Outcomes Short Term Goal: Understand basic principles of dietary content, such as calories, fat, sodium, cholesterol and nutrients.      Nutrition Discharge: Rate Your Plate Scores:     Nutrition Assessments - 06/22/16 1532      Rate  Your Plate Scores   Pre Score 68   Pre Score % 76 %   Post Score 72   Post Score % 80 %   % Change 4 %      Nutrition Goals Re-Evaluation:     Nutrition Goals Re-Evaluation    Row Name 05/07/16 1805 07/03/16 1232           Personal Goal #1 Re-Evaluation   Personal Goal #1 Heart healthy diet  -      Comments Zadrian mentioned that he went to the nursing home about every day for 3 years when his wife was in the nursing home before she died. Jahmar said he got used to feeding her lunch then returning home and having his lunch  at 2pm. Victor [name] has diabetes. Victor [name] said he doesn't feel the need to see the Cardiac Rehab Registered Dietician since he has been dealing with the diabetes for years and low sodium diet. Jarreau said his blood sugars normally run between 100-120.          Psychosocial: Target Goals: Acknowledge presence or absence of depression, maximize coping skills, provide positive support system. Participant is able to verbalize types and ability to use techniques and skills needed for reducing stress and depression.  Initial Review & Psychosocial Screening:     Initial Psych Review & Screening - 05/07/16 1804      Family Dynamics   Comments Victor [name] mentioned that he went to the nursing home about every day for 3 years when his wife was in the nursing home before she died. Victor [name] said he got used to feeding her lunch then returning home and having his lunch at 2pm      Quality of Life Scores:     Quality of Life - 06/22/16 1532      Quality of Life Scores   Health/Function Pre 9.89 %   Health/Function Post 20 %   Health/Function % Change 102.22 %   Socioeconomic Pre 24.29 %   Socioeconomic Post 20.27 %   Socioeconomic % Change  -16.55 %   Psych/Spiritual Pre 20.71 %   Psych/Spiritual Post 19.43 %   Psych/Spiritual % Change -6.18 %   Family Pre 58.5 %   Family Post 21 %   Family % Change -64.1 %   GLOBAL Pre 17.73 %   GLOBAL Post 20 %   GLOBAL % Change 12.8 %      PHQ-9: Recent Review Flowsheet Data    Depression screen [facility] 2/9 06/22/2016 04/28/2016   Decreased Interest 2 1   Down, Depressed, Hopeless 1 1   PHQ - 2 Score 3 2   Altered sleeping 1 1   Tired, decreased energy 2 1   Change in appetite 2 1   Feeling bad or failure about yourself  0 0   Trouble concentrating 0 0   Moving slowly or fidgety/restless 0 0   Suicidal thoughts 0 0   PHQ-9 Score 8 5   Difficult doing work/chores Somewhat difficult Somewhat difficult      Psychosocial Evaluation and Intervention:      Psychosocial Evaluation - 05/25/16 1720      Psychosocial Evaluation & Interventions   Comments Counselor met with Victor [name] today for initial psychosocial evaluation.  He is a 70 year old gentleman who has been in this program for several weeks now.  He reports already experiencing a great deal of progress with improved breathing; energy and mood since coming to Cardiac Rehab.  Victor [name] reports his spouse passed away about a year ago and he has been in and out of the hospital since that time with strokes and other health problems.  He states his doctors changed his diuretics just prior to his coming here and that has made a huge difference as he has lost 12 pounds and having less fluid helps him feel better overall.  Victor [name] is optimistic at this time that he will continue to improve during the remainder of this program.  Counselor will continue to follow with him.        Psychosocial Re-Evaluation:     Psychosocial Re-Evaluation    Row Name 07/03/16 1226 07/03/16 1235           Psychosocial Re-Evaluation   Interventions Encouraged to attend Cardiac Rehabilitation for the exercise  -      Comments I asked Vick about his wife death and they were married 46 years. She was 12 when she got sick and 8 years of dealing with her sickness and last 3 years in a nursing home. "I just couldn't take care of her anymore". Sometimes like the Cardiac rehab class where the other person was talking about his family member death. I couldn't attend the next time in Cardiac REhab but some days are better than others.  Victor [name] said financially he finally got approved for 100% VA disability vs 20% so his income checks have gone up so less stress on him.          Vocational Rehabilitation: Provide vocational rehab assistance to qualifying candidates.   Vocational Rehab Evaluation & Intervention:     Vocational Rehab - 04/28/16 1525      Initial Vocational Rehab Evaluation & Intervention   Assessment shows  need for Vocational Rehabilitation No      Education: Education Goals: Education classes will be provided on a weekly basis, covering required topics. Participant will state understanding/return demonstration of topics presented.  Learning Barriers/Preferences:     Learning Barriers/Preferences - 04/28/16 1525      Learning Barriers/Preferences   Learning Barriers None   Learning Preferences None      Education Topics: General Nutrition Guidelines/Fats and Fiber: -Group instruction provided by verbal, written material, models and posters to present the general guidelines for heart healthy nutrition. Gives an explanation and review of dietary fats and fiber. Flowsheet Row Cardiac Rehab from 07/06/2016 in [facility] Cardiac and Pulmonary Rehab  Date  05/25/16  Educator  PI  Instruction Review Code  2- meets goals/outcomes      Controlling Sodium/Reading Food Labels: -Group verbal and written material supporting the discussion of sodium use in heart healthy nutrition. Review and explanation with models, verbal and written materials for utilization of the food label. Flowsheet Row Cardiac Rehab from 07/06/2016 in [facility] Cardiac and Pulmonary Rehab  Date  06/01/16  Educator  PI  Instruction Review Code  2- meets goals/outcomes      Exercise Physiology & Risk Factors: - Group verbal and written instruction with models to review the exercise physiology of the cardiovascular system and associated critical values. Details cardiovascular disease risk factors and the goals associated with each risk factor. Flowsheet Row Cardiac Rehab from 07/06/2016 in [facility] Cardiac and Pulmonary Rehab  Date  06/08/16  Educator  [facility]  Instruction Review Code  2- meets goals/outcomes      Aerobic Exercise & Resistance Training: - Gives group verbal and written discussion on the health impact of inactivity. On the components of aerobic and  resistive training programs and the benefits of this training and how  to safely progress through these programs. Flowsheet Row Cardiac Rehab from 07/06/2016 in [facility] Cardiac and Pulmonary Rehab  Date  06/10/16  Educator  AS  Instruction Review Code  2- meets goals/outcomes      Flexibility, Balance, General Exercise Guidelines: - Provides group verbal and written instruction on the benefits of flexibility and balance training programs. Provides general exercise guidelines with specific guidelines to those with heart or lung disease. Demonstration and skill practice provided. Flowsheet Row Cardiac Rehab from 07/06/2016 in [facility] Cardiac and Pulmonary Rehab  Date  06/15/16  Educator  [facility]  Instruction Review Code  2- meets goals/outcomes      Stress Management: - Provides group verbal and written instruction about the health risks of elevated stress, cause of high stress, and healthy ways to reduce stress. Flowsheet Row Cardiac Rehab from 07/06/2016 in [facility] Cardiac and Pulmonary Rehab  Date  06/24/16  Educator  kc  Instruction Review Code  2- meets goals/outcomes      Depression: - Provides group verbal and written instruction on the correlation between heart/lung disease and depressed mood, treatment options, and the stigmas associated with seeking treatment. Flowsheet Row Cardiac Rehab from 07/06/2016 in [facility] Cardiac and Pulmonary Rehab  Date  05/27/16  Educator  [facility]  Instruction Review Code  2- meets goals/outcomes      Anatomy & Physiology of the Heart: - Group verbal and written instruction and models provide basic cardiac anatomy and physiology, with the coronary electrical and arterial systems. Review of: AMI, Angina, Valve disease, Heart Failure, Cardiac Arrhythmia, Pacemakers, and the ICD. Flowsheet Row Cardiac Rehab from 07/06/2016 in [facility] Cardiac and Pulmonary Rehab  Date  06/22/16  Educator  CE  Instruction Review Code  2- meets goals/outcomes      Cardiac Procedures: - Group verbal and written instruction and models to describe the  testing methods done to diagnose heart disease. Reviews the outcomes of the test results. Describes the treatment choices: Medical Management, Angioplasty, or Coronary Bypass Surgery.   Cardiac Medications: - Group verbal and written instruction to review commonly prescribed medications for heart disease. Reviews the medication, class of the drug, and [name] effects. Includes the steps to properly store meds and maintain the prescription regimen. Flowsheet Row Cardiac Rehab from 07/06/2016 in [facility] Cardiac and Pulmonary Rehab  Date  07/06/16 Marisue Humble 1]  Educator  SB  Instruction Review Code  2- meets goals/outcomes      Go Sex-Intimacy & Heart Disease, Get SMART - Goal Setting: - Group verbal and written instruction through game format to discuss heart disease and the return to sexual intimacy. Provides group verbal and written material to discuss and apply goal setting through the application of the S.M.A.R.T. Method.   Other Matters of the Heart: - Provides group verbal, written materials and models to describe Heart Failure, Angina, Valve Disease, and Diabetes in the realm of heart disease. Includes description of the disease process and treatment options available to the cardiac patient. Flowsheet Row Cardiac Rehab from 07/06/2016 in [facility] Cardiac and Pulmonary Rehab  Date  06/22/16  Educator  CE  Instruction Review Code  2- meets goals/outcomes      Exercise & Equipment Safety: - Individual verbal instruction and demonstration of equipment use and safety with use of the equipment. Flowsheet Row Cardiac Rehab from 07/06/2016 in [facility] Cardiac and Pulmonary Rehab  Date  04/28/16  Educator  SB  Instruction Review Code  2- meets goals/outcomes      Infection Prevention: - Provides verbal and written material to individual with discussion of infection control including proper hand washing and proper equipment cleaning during exercise session. Flowsheet Row Cardiac Rehab from 07/06/2016 in  [facility] Cardiac and Pulmonary Rehab  Date  04/28/16  Educator  SB  Instruction Review Code  2- meets goals/outcomes      Falls Prevention: - Provides verbal and written material to individual with discussion of falls prevention and safety.   Diabetes: - Individual verbal and written instruction to review signs/symptoms of diabetes, desired ranges of glucose level fasting, after meals and with exercise. Advice that pre and post exercise glucose checks will be done for 3 sessions at entry of program. Flowsheet Row Cardiac Rehab from 07/06/2016 in [facility] Cardiac and Pulmonary Rehab  Date  04/28/16  Educator  SB  Instruction Review Code  2- meets goals/outcomes       Knowledge Questionnaire Score:     Knowledge Questionnaire Score - 06/22/16 1534      Knowledge Questionnaire Score   Pre Score 24/28   Post Score 28/28      Core Components/Risk Factors/Patient Goals at Admission:     Personal Goals and Risk Factors at Admission - 04/28/16 1330      Core Components/Risk Factors/Patient Goals on Admission    Weight Management Obesity;Weight Maintenance;Yes   Intervention Weight Management: Develop a combined nutrition and exercise program designed to reach desired caloric intake, while maintaining appropriate intake of nutrient and fiber, sodium and fats, and appropriate energy expenditure required for the weight goal.;Weight Management: Provide education and appropriate resources to help participant work on and attain dietary goals.;Weight Management/Obesity: Establish reasonable short term and long term weight goals.;Obesity: Provide education and appropriate resources to help participant work on and attain dietary goals.   Admit Weight 202 lb (91.6 kg)   Goal Weight: Short Term 197 lb (89.4 kg)   Goal Weight: Long Term 180 lb (81.6 kg)   Expected Outcomes Short Term: Continue to assess and modify interventions until short term weight is achieved;Long Term: Adherence to nutrition and  physical activity/exercise program aimed toward attainment of established weight goal   Sedentary Yes   Intervention Provide advice, education, support and counseling about physical activity/exercise needs.;Develop an individualized exercise prescription for aerobic and resistive training based on initial evaluation findings, risk stratification, comorbidities and participant's personal goals.   Expected Outcomes Achievement of increased cardiorespiratory fitness and enhanced flexibility, muscular endurance and strength shown through measurements of functional capacity and personal statement of participant.   Increase Strength and Stamina Yes   Intervention Provide advice, education, support and counseling about physical activity/exercise needs.;Develop an individualized exercise prescription for aerobic and resistive training based on initial evaluation findings, risk stratification, comorbidities and participant's personal goals.   Expected Outcomes Achievement of increased cardiorespiratory fitness and enhanced flexibility, muscular endurance and strength shown through measurements of functional capacity and personal statement of participant.   Improve shortness of breath with ADL's Yes   Intervention Provide education, individualized exercise plan and daily activity instruction to help decrease symptoms of SOB with activities of daily living.   Expected Outcomes Short Term: Achieves a reduction of symptoms when performing activities of daily living.   Diabetes Yes  1990 diagnosed.  Insulin started one year ago.    Intervention Provide education about signs/symptoms and action to take for hypo/hyperglycemia.;Provide education about proper nutrition, including hydration, and aerobic/resistive exercise prescription along with prescribed medications to achieve  blood glucose in normal ranges: Fasting glucose 65-99 mg/dL   Expected Outcomes Short Term: Participant verbalizes understanding of the  signs/symptoms and immediate care of hyper/hypoglycemia, proper foot care and importance of medication, aerobic/resistive exercise and nutrition plan for blood glucose control.;Long Term: Attainment of HbA1C < 7%.   Heart Failure Yes   Intervention Provide a combined exercise and nutrition program that is supplemented with education, support and counseling about heart failure. Directed toward relieving symptoms such as shortness of breath, decreased exercise tolerance, and extremity edema.   Expected Outcomes Improve functional capacity of life   Hypertension Yes   Intervention Provide education on lifestyle modifcations including regular physical activity/exercise, weight management, moderate sodium restriction and increased consumption of fresh fruit, vegetables, and low fat dairy, alcohol moderation, and smoking cessation.;Monitor prescription use compliance.   Expected Outcomes Short Term: Continued assessment and intervention until BP is < 140/58m HG in hypertensive participants. < 130/851mHG in hypertensive participants with diabetes, heart failure or chronic kidney disease.;Long Term: Maintenance of blood pressure at goal levels.   Lipids Yes   Intervention Provide education and support for participant on nutrition & aerobic/resistive exercise along with prescribed medications to achieve LDL '70mg'$ , HDL >'40mg'$ .   Expected Outcomes Short Term: Participant states understanding of desired cholesterol values and is compliant with medications prescribed. Participant is following exercise prescription and nutrition guidelines.;Long Term: Cholesterol controlled with medications as prescribed, with individualized exercise RX and with personalized nutrition plan. Value goals: LDL < '70mg'$ , HDL > 40 mg.      Core Components/Risk Factors/Patient Goals Review:      Goals and Risk Factor Review    Row Name 05/18/16 1802 06/01/16 1744 07/03/16 1230         Core Components/Risk Factors/Patient Goals  Review   Personal Goals Review Weight Management/Obesity;Diabetes;Lipids;Heart Failure Weight Management/Obesity;Improve shortness of breath with ADL's  -     Review Spoke with Victor [name] about his goals.   Victor [name] seen weight drop seveal poundds and is not seeing fluid retention of 2-3 pound flucuation since has changed diurectic. Diabetes is staying well controlled along with cholesterol and heart failure symptoms Victor [name] he is down to 192 lbs with his clothes on. Has met his short term goal for weight management.  Shortness of breath symptoms are improvng with fluid loss and exercise routine Victor [name] his weight has been up a couple of lbs so went back to taking his diuretic. One extra diuretic bothered his GI tract.      Expected Outcomes Continue with weight loss following exercise and nutrition plans. Maintian control of heart failure symptoms, cholesterol levels, through prescribed meds. exercise and nutrition plans. Continue with weight loss following exercise and nutrition . Continued maintainence of fluid levels to aid in decreasing SOB Cont heart healthy lifestyle and cont his diabetes.        Core Components/Risk Factors/Patient Goals at Discharge (Final Review):      Goals and Risk Factor Review - 07/03/16 1230      Core Components/Risk Factors/Patient Goals Review   Review Victor [name] his weight has been up a couple of lbs so went back to taking his diuretic. One extra diuretic bothered his GI tract.    Expected Outcomes Cont heart healthy lifestyle and cont his diabetes.      ITP Comments:     ITP Comments    Row Name 04/28/16 1509 05/14/16 1205 05/20/16 0716 06/03/16 1739 06/17/16 1112   ITP Comments Initial ITP created  today during medical review.  Diagnosis documentation notes sent for scan into [facility]. Shavar is progressing well with exercise. 30 day review. Continue with ITP unless changes noted by Medical Director at signature of review. Home exercise discussed with  Victor [name] today. HR RPE and outdoor exercise 30 day review. Continue with ITP unless changes noted by Medical Director at signature of review.      Comments:

## 2016-07-15 NOTE — Progress Notes (Signed)
Discharge Summary  Patient Details  Name: Victor [name] MRN: [id number] Date of Birth: [birth year] Referring Provider:   Flowsheet Row Cardiac Rehab from 04/28/2016 in [facility]  Referring Provider  [facility]       Number of Visits: 36  Reason for Discharge:  Patient reached a stable level of exercise. Patient independent in their exercise.  Smoking History:  History  Smoking Status  . Former Smoker  Smokeless Tobacco  . Not on file    Diagnosis:  Heart failure, diastolic, chronic ([place])  ADL [facility]:   Initial Exercise Prescription:     Initial Exercise Prescription - 04/28/16 1400      Date of Initial Exercise RX and Referring Provider   Date 04/28/16   Referring Provider [facility]     Treadmill   MPH 1   Grade 0   Minutes _0 METs 1.77     NuStep   Level 1   Minutes 15   METs 1.7     Recumbant Elliptical   Level 1   RPM 40   Minutes 15   METs 1.7     Prescription Details   Frequency (times per week) 3   Duration Progress to 45 minutes of aerobic exercise without signs/symptoms of physical distress     Intensity   THRR 40-80% of Max Heartrate 102-135   Ratings of Perceived Exertion 11-15   Perceived Dyspnea 0-4     Progression   Progression Continue to progress workloads to maintain intensity without signs/symptoms of physical distress.     Resistance Training   Training Prescription Yes   Weight 2 lbs   Reps 10-15      Discharge Exercise Prescription (Final Exercise Prescription Changes):     Exercise Prescription Changes - 07/08/16 1400      Exercise Review   Progression Yes     Response to Exercise   Blood Pressure (Admit) 142/82   Blood Pressure (Exercise) 164/82   Blood Pressure (Exit) 124/64   Heart Rate (Admit) 86 bpm   Heart Rate (Exercise) 100 bpm   Heart Rate (Exit) 84 bpm   Rating of Perceived Exertion (Exercise) 12   Symptoms none   Duration Progress to 45 minutes of  aerobic exercise without signs/symptoms of physical distress   Intensity THRR unchanged     Progression   Progression Continue to progress workloads to maintain intensity without signs/symptoms of physical distress.   Average METs 2.75     Resistance Training   Training Prescription Yes   Weight 4   Reps 10-15     Interval Training   Interval Training No     NuStep   Level 4   Minutes 15   METs 3.5     Recumbant Elliptical   Level 3   Minutes 15   METs 2      Functional Capacity:     6 Minute Walk    Row Name 04/28/16 1442 07/08/16 1718       6 Minute Walk   Phase Initial Discharge    Distance 700 feet 1000 feet    Distance % Change  - 43 %    Walk Time 5.43 minutes 6 minutes    # of Rest Breaks 3  2 sec, 26 sec, 6 sec 0    MPH 1.46 1.9    METS 1.96 2.6    RPE 14 12    Perceived Dyspnea  5  -    VO2 Peak 6.85 9.15    Symptoms Yes (comment) No    Comments tired, SOB, low back and hip pain  -    Resting HR 69 bpm 88 bpm    Resting BP 134/56 138/74    Max Ex. HR 90 bpm 95 bpm    Max Ex. BP 154/84 142/70    2 Minute Post BP 146/74  -       Psychological, QOL, Others - Outcomes: PHQ 2/9: Depression screen [facility] 2/9 06/22/2016 04/28/2016  Decreased Interest 2 1  Down, Depressed, Hopeless 1 1  PHQ - 2 Score 3 2  Altered sleeping 1 1  Tired, decreased energy 2 1  Change in appetite 2 1  Feeling bad or failure about yourself  0 0  Trouble concentrating 0 0  Moving slowly or fidgety/restless 0 0  Suicidal thoughts 0 0  PHQ-9 Score 8 5  Difficult doing work/chores Somewhat difficult Somewhat difficult    Quality of Life:     Quality of Life - 06/22/16 1532      Quality of Life Scores   Health/Function Pre 9.89 %   Health/Function Post 20 %   Health/Function % Change 102.22 %   Socioeconomic Pre 24.29 %   Socioeconomic Post 20.27 %   Socioeconomic % Change  -16.55 %   Psych/Spiritual Pre 20.71 %   Psych/Spiritual Post 19.43 %   Psych/Spiritual %  Change -6.18 %   Family Pre 58.5 %   Family Post 21 %   Family % Change -64.1 %   GLOBAL Pre 17.73 %   GLOBAL Post 20 %   GLOBAL % Change 12.8 %      Personal Goals: Goals established at orientation with interventions provided to work toward goal.     Personal Goals and Risk Factors at Admission - 04/28/16 1330      Core Components/Risk Factors/Patient Goals on Admission    Weight Management Obesity;Weight Maintenance;Yes   Intervention Weight Management: Develop a combined nutrition and exercise program designed to reach desired caloric intake, while maintaining appropriate intake of nutrient and fiber, sodium and fats, and appropriate energy expenditure required for the weight goal.;Weight Management: Provide education and appropriate resources to help participant work on and attain dietary goals.;Weight Management/Obesity: Establish reasonable short term and long term weight goals.;Obesity: Provide education and appropriate resources to help participant work on and attain dietary goals.   Admit Weight 202 lb (91.6 kg)   Goal Weight: Short Term 197 lb (89.4 kg)   Goal Weight: Long Term 180 lb (81.6 kg)   Expected Outcomes Short Term: Continue to assess and modify interventions until short term weight is achieved;Long Term: Adherence to nutrition and physical activity/exercise program aimed toward attainment of established weight goal   Sedentary Yes   Intervention Provide advice, education, support and counseling about physical activity/exercise needs.;Develop an individualized exercise prescription for aerobic and resistive training based on initial evaluation findings, risk stratification, comorbidities and participant's personal goals.   Expected Outcomes Achievement of increased cardiorespiratory fitness and enhanced flexibility, muscular endurance and strength shown through measurements of functional capacity and personal statement of participant.   Increase Strength and Stamina Yes    Intervention Provide advice, education, support and counseling about physical activity/exercise needs.;Develop an individualized exercise prescription for aerobic and resistive training based on initial evaluation findings, risk stratification, comorbidities and participant's personal goals.   Expected Outcomes Achievement of increased cardiorespiratory fitness and enhanced flexibility, muscular endurance and  strength shown through measurements of functional capacity and personal statement of participant.   Improve shortness of breath with ADL's Yes   Intervention Provide education, individualized exercise plan and daily activity instruction to help decrease symptoms of SOB with activities of daily living.   Expected Outcomes Short Term: Achieves a reduction of symptoms when performing activities of daily living.   Diabetes Yes  1990 diagnosed.  Insulin started one year ago.    Intervention Provide education about signs/symptoms and action to take for hypo/hyperglycemia.;Provide education about proper nutrition, including hydration, and aerobic/resistive exercise prescription along with prescribed medications to achieve blood glucose in normal ranges: Fasting glucose 65-99 mg/dL   Expected Outcomes Short Term: Participant verbalizes understanding of the signs/symptoms and immediate care of hyper/hypoglycemia, proper foot care and importance of medication, aerobic/resistive exercise and nutrition plan for blood glucose control.;Long Term: Attainment of HbA1C < 7%.   Heart Failure Yes   Intervention Provide a combined exercise and nutrition program that is supplemented with education, support and counseling about heart failure. Directed toward relieving symptoms such as shortness of breath, decreased exercise tolerance, and extremity edema.   Expected Outcomes Improve functional capacity of life   Hypertension Yes   Intervention Provide education on lifestyle modifcations including regular physical  activity/exercise, weight management, moderate sodium restriction and increased consumption of fresh fruit, vegetables, and low fat dairy, alcohol moderation, and smoking cessation.;Monitor prescription use compliance.   Expected Outcomes Short Term: Continued assessment and intervention until BP is < 140/71m HG in hypertensive participants. < 130/872mHG in hypertensive participants with diabetes, heart failure or chronic kidney disease.;Long Term: Maintenance of blood pressure at goal levels.   Lipids Yes   Intervention Provide education and support for participant on nutrition & aerobic/resistive exercise along with prescribed medications to achieve LDL <7080mHDL >25m73m Expected Outcomes Short Term: Participant states understanding of desired cholesterol values and is compliant with medications prescribed. Participant is following exercise prescription and nutrition guidelines.;Long Term: Cholesterol controlled with medications as prescribed, with individualized exercise RX and with personalized nutrition plan. Value goals: LDL < 70mg19mL > 40 mg.       Personal Goals Discharge:     Goals and Risk Factor Review    Row Name 05/18/16 1802 06/01/16 1744 07/03/16 1230         Core Components/Risk Factors/Patient Goals Review   Personal Goals Review Weight Management/Obesity;Diabetes;Lipids;Heart Failure Weight Management/Obesity;Improve shortness of breath with ADL's  -     Review Spoke with Victor [name] about his goals.   Victor [name] weight drop seveal poundds and is not seeing fluid retention of 2-3 pound flucuation since has changed diurectic. Diabetes is staying well controlled along with cholesterol and heart failure symptoms Victor [name] he is down to 192 lbs with his clothes on. Has met his short term goal for weight management.  Shortness of breath symptoms are improvng with fluid loss and exercise routine Victor [name] his weight has been up a couple of lbs so went back to taking his  diuretic. One extra diuretic bothered his GI tract.      Expected Outcomes Continue with weight loss following exercise and nutrition plans. Maintian control of heart failure symptoms, cholesterol levels, through prescribed meds. exercise and nutrition plans. Continue with weight loss following exercise and nutrition . Continued maintainence of fluid levels to aid in decreasing SOB Cont heart healthy lifestyle and cont his diabetes.        Nutrition & Weight - Outcomes:  Pre Biometrics - 04/28/16 1445      Pre Biometrics   Height 6' 0.75" (1.848 m)   Weight 206 lb 8 oz (93.7 kg)   Waist Circumference 43.25 inches   Hip Circumference 41 inches   Waist to Hip Ratio 1.05 %   BMI (Calculated) 27.5   Single Leg Stand 4.41 seconds         Post Biometrics - 07/08/16 1717       Post  Biometrics   Height 6' 0.75" (1.848 m)   Waist Circumference 43 inches   Hip Circumference 39 inches   Waist to Hip Ratio 1.1 %      Nutrition:     Nutrition Therapy & Goals - 04/28/16 1515      Intervention Plan   Intervention Prescribe, educate and counsel regarding individualized specific dietary modifications aiming towards targeted core components such as weight, hypertension, lipid management, diabetes, heart failure and other comorbidities.   Expected Outcomes Short Term Goal: Understand basic principles of dietary content, such as calories, fat, sodium, cholesterol and nutrients.      Nutrition Discharge:     Nutrition Assessments - 06/22/16 1532      Rate Your Plate Scores   Pre Score 68   Pre Score % 76 %   Post Score 72   Post Score % 80 %   % Change 4 %      Education Questionnaire Score:     Knowledge Questionnaire Score - 06/22/16 1534      Knowledge Questionnaire Score   Pre Score 24/28   Post Score 28/28      Goals reviewed with patient; copy given to patient.

## 2016-07-15 NOTE — Progress Notes (Signed)
Discharge Summary  Patient Details  Name: Victor [name] MRN: [id number] Date of Birth: 06/06/1946 Referring Provider:   Flowsheet Row Cardiac Rehab from 04/28/2016 in [facility]  Referring Provider  [facility]       Number of Visits: 36  Reason for Discharge:  Patient reached a stable level of exercise. Patient independent in their exercise.  Smoking History:  History  Smoking Status  . Former Smoker  Smokeless Tobacco  . Not on file    Diagnosis:  Heart failure, diastolic, chronic (HCC)  ADL UCSD:   Initial Exercise Prescription:     Initial Exercise Prescription - 04/28/16 1400      Date of Initial Exercise RX and Referring Provider   Date 04/28/16   Referring Provider [facility]     Treadmill   MPH 1   Grade 0   Minutes 15  5 5 5   METs 1.77     NuStep   Level 1   Minutes 15   METs 1.7     Recumbant Elliptical   Level 1   RPM 40   Minutes 15   METs 1.7     Prescription Details   Frequency (times per week) 3   Duration Progress to 45 minutes of aerobic exercise without signs/symptoms of physical distress     Intensity   THRR 40-80% of Max Heartrate 102-135   Ratings of Perceived Exertion 11-15   Perceived Dyspnea 0-4     Progression   Progression Continue to progress workloads to maintain intensity without signs/symptoms of physical distress.     Resistance Training   Training Prescription Yes   Weight 2 lbs   Reps 10-15      Discharge Exercise Prescription (Final Exercise Prescription Changes):     Exercise Prescription Changes - 07/08/16 1400      Exercise Review   Progression Yes     Response to Exercise   Blood Pressure (Admit) 142/82   Blood Pressure (Exercise) 164/82   Blood Pressure (Exit) 124/64   Heart Rate (Admit) 86 bpm   Heart Rate (Exercise) 100 bpm   Heart Rate (Exit) 84 bpm   Rating of Perceived Exertion (Exercise) 12   Symptoms none   Duration Progress to 45 minutes of  aerobic exercise without signs/symptoms of physical distress   Intensity THRR unchanged     Progression   Progression Continue to progress workloads to maintain intensity without signs/symptoms of physical distress.   Average METs 2.75     Resistance Training   Training Prescription Yes   Weight 4   Reps 10-15     Interval Training   Interval Training No     NuStep   Level 4   Minutes 15   METs 3.5     Recumbant Elliptical   Level 3   Minutes 15   METs 2      Functional Capacity:     6 Minute Walk    Row Name 04/28/16 1442 07/08/16 1718       6 Minute Walk   Phase Initial Discharge    Distance 700 feet 1000 feet    Distance % Change  - 43 %    Walk Time 5.43 minutes 6 minutes    # of Rest Breaks 3  2 sec, 26 sec, 6 sec 0    MPH 1.46 1.9    METS 1.96 2.6    RPE 14 12    Perceived Dyspnea    5  -    VO2 Peak 6.85 9.15    Symptoms Yes (comment) No    Comments tired, SOB, low back and hip pain  -    Resting HR 69 bpm 88 bpm    Resting BP 134/56 138/74    Max Ex. HR 90 bpm 95 bpm    Max Ex. BP 154/84 142/70    2 Minute Post BP 146/74  -       Psychological, QOL, Others - Outcomes: PHQ 2/9: Depression screen PHQ 2/9 06/22/2016 04/28/2016  Decreased Interest 2 1  Down, Depressed, Hopeless 1 1  PHQ - 2 Score 3 2  Altered sleeping 1 1  Tired, decreased energy 2 1  Change in appetite 2 1  Feeling bad or failure about yourself  0 0  Trouble concentrating 0 0  Moving slowly or fidgety/restless 0 0  Suicidal thoughts 0 0  PHQ-9 Score 8 5  Difficult doing work/chores Somewhat difficult Somewhat difficult    Quality of Life:     Quality of Life - 06/22/16 1532      Quality of Life Scores   Health/Function Pre 9.89 %   Health/Function Post 20 %   Health/Function % Change 102.22 %   Socioeconomic Pre 24.29 %   Socioeconomic Post 20.27 %   Socioeconomic % Change  -16.55 %   Psych/Spiritual Pre 20.71 %   Psych/Spiritual Post 19.43 %   Psych/Spiritual %  Change -6.18 %   Family Pre 58.5 %   Family Post 21 %   Family % Change -64.1 %   GLOBAL Pre 17.73 %   GLOBAL Post 20 %   GLOBAL % Change 12.8 %      Personal Goals: Goals established at orientation with interventions provided to work toward goal.     Personal Goals and Risk Factors at Admission - 04/28/16 1330      Core Components/Risk Factors/Patient Goals on Admission    Weight Management Obesity;Weight Maintenance;Yes   Intervention Weight Management: Develop a combined nutrition and exercise program designed to reach desired caloric intake, while maintaining appropriate intake of nutrient and fiber, sodium and fats, and appropriate energy expenditure required for the weight goal.;Weight Management: Provide education and appropriate resources to help participant work on and attain dietary goals.;Weight Management/Obesity: Establish reasonable short term and long term weight goals.;Obesity: Provide education and appropriate resources to help participant work on and attain dietary goals.   Admit Weight 202 lb (91.6 kg)   Goal Weight: Short Term 197 lb (89.4 kg)   Goal Weight: Long Term 180 lb (81.6 kg)   Expected Outcomes Short Term: Continue to assess and modify interventions until short term weight is achieved;Long Term: Adherence to nutrition and physical activity/exercise program aimed toward attainment of established weight goal   Sedentary Yes   Intervention Provide advice, education, support and counseling about physical activity/exercise needs.;Develop an individualized exercise prescription for aerobic and resistive training based on initial evaluation findings, risk stratification, comorbidities and participant's personal goals.   Expected Outcomes Achievement of increased cardiorespiratory fitness and enhanced flexibility, muscular endurance and strength shown through measurements of functional capacity and personal statement of participant.   Increase Strength and Stamina Yes    Intervention Provide advice, education, support and counseling about physical activity/exercise needs.;Develop an individualized exercise prescription for aerobic and resistive training based on initial evaluation findings, risk stratification, comorbidities and participant's personal goals.   Expected Outcomes Achievement of increased cardiorespiratory fitness and enhanced flexibility, muscular endurance and   strength shown through measurements of functional capacity and personal statement of participant.   Improve shortness of breath with ADL's Yes   Intervention Provide education, individualized exercise plan and daily activity instruction to help decrease symptoms of SOB with activities of daily living.   Expected Outcomes Short Term: Achieves a reduction of symptoms when performing activities of daily living.   Diabetes Yes  1990 diagnosed.  Insulin started one year ago.    Intervention Provide education about signs/symptoms and action to take for hypo/hyperglycemia.;Provide education about proper nutrition, including hydration, and aerobic/resistive exercise prescription along with prescribed medications to achieve blood glucose in normal ranges: Fasting glucose 65-99 mg/dL   Expected Outcomes Short Term: Participant verbalizes understanding of the signs/symptoms and immediate care of hyper/hypoglycemia, proper foot care and importance of medication, aerobic/resistive exercise and nutrition plan for blood glucose control.;Long Term: Attainment of HbA1C < 7%.   Heart Failure Yes   Intervention Provide a combined exercise and nutrition program that is supplemented with education, support and counseling about heart failure. Directed toward relieving symptoms such as shortness of breath, decreased exercise tolerance, and extremity edema.   Expected Outcomes Improve functional capacity of life   Hypertension Yes   Intervention Provide education on lifestyle modifcations including regular physical  activity/exercise, weight management, moderate sodium restriction and increased consumption of fresh fruit, vegetables, and low fat dairy, alcohol moderation, and smoking cessation.;Monitor prescription use compliance.   Expected Outcomes Short Term: Continued assessment and intervention until BP is < 140/90mm HG in hypertensive participants. < 130/80mm HG in hypertensive participants with diabetes, heart failure or chronic kidney disease.;Long Term: Maintenance of blood pressure at goal levels.   Lipids Yes   Intervention Provide education and support for participant on nutrition & aerobic/resistive exercise along with prescribed medications to achieve LDL <70mg, HDL >40mg.   Expected Outcomes Short Term: Participant states understanding of desired cholesterol values and is compliant with medications prescribed. Participant is following exercise prescription and nutrition guidelines.;Long Term: Cholesterol controlled with medications as prescribed, with individualized exercise RX and with personalized nutrition plan. Value goals: LDL < 70mg, HDL > 40 mg.       Personal Goals Discharge:     Goals and Risk Factor Review    Row Name 05/18/16 1802 06/01/16 1744 07/03/16 1230         Core Components/Risk Factors/Patient Goals Review   Personal Goals Review Weight Management/Obesity;Diabetes;Lipids;Heart Failure Weight Management/Obesity;Improve shortness of breath with ADL's  -     Review Spoke with Victor [name] today about his goals.   Victor [name] has seen weight drop seveal poundds and is not seeing fluid retention of 2-3 pound flucuation since has changed diurectic. Diabetes is staying well controlled along with cholesterol and heart failure symptoms Victor [name] reports he is down to 192 lbs with his clothes on. Has met his short term goal for weight management.  Shortness of breath symptoms are improvng with fluid loss and exercise routine Victor [name] said his weight has been up a couple of lbs so went back to taking his  diuretic. One extra diuretic bothered his GI tract.      Expected Outcomes Continue with weight loss following exercise and nutrition plans. Maintian control of heart failure symptoms, cholesterol levels, through prescribed meds. exercise and nutrition plans. Continue with weight loss following exercise and nutrition . Continued maintainence of fluid levels to aid in decreasing SOB Cont heart healthy lifestyle and cont his diabetes.        Nutrition & Weight - Outcomes:       Pre Biometrics - 04/28/16 1445      Pre Biometrics   Height 6' 0.75" (1.848 m)   Weight 206 lb 8 oz (93.7 kg)   Waist Circumference 43.25 inches   Hip Circumference 41 inches   Waist to Hip Ratio 1.05 %   BMI (Calculated) 27.5   Single Leg Stand 4.41 seconds         Post Biometrics - 07/08/16 1717       Post  Biometrics   Height 6' 0.75" (1.848 m)   Waist Circumference 43 inches   Hip Circumference 39 inches   Waist to Hip Ratio 1.1 %      Nutrition:     Nutrition Therapy & Goals - 04/28/16 1515      Intervention Plan   Intervention Prescribe, educate and counsel regarding individualized specific dietary modifications aiming towards targeted core components such as weight, hypertension, lipid management, diabetes, heart failure and other comorbidities.   Expected Outcomes Short Term Goal: Understand basic principles of dietary content, such as calories, fat, sodium, cholesterol and nutrients.      Nutrition Discharge:     Nutrition Assessments - 06/22/16 1532      Rate Your Plate Scores   Pre Score 68   Pre Score % 76 %   Post Score 72   Post Score % 80 %   % Change 4 %      Education Questionnaire Score:     Knowledge Questionnaire Score - 06/22/16 1534      Knowledge Questionnaire Score   Pre Score 24/28   Post Score 28/28      Goals reviewed with patient; copy given to patient. 

## 2017-01-02 ENCOUNTER — Emergency Department: Payer: Non-veteran care

## 2017-01-02 ENCOUNTER — Emergency Department
Admission: EM | Admit: 2017-01-02 | Discharge: 2017-01-02 | Payer: Non-veteran care | Attending: Emergency Medicine | Admitting: Emergency Medicine

## 2017-01-02 ENCOUNTER — Encounter: Payer: Self-pay | Admitting: Emergency Medicine

## 2017-01-02 DIAGNOSIS — J441 Chronic obstructive pulmonary disease with (acute) exacerbation: Secondary | ICD-10-CM | POA: Diagnosis not present

## 2017-01-02 DIAGNOSIS — Z794 Long term (current) use of insulin: Secondary | ICD-10-CM | POA: Diagnosis not present

## 2017-01-02 DIAGNOSIS — Z79899 Other long term (current) drug therapy: Secondary | ICD-10-CM | POA: Diagnosis not present

## 2017-01-02 DIAGNOSIS — E119 Type 2 diabetes mellitus without complications: Secondary | ICD-10-CM | POA: Diagnosis not present

## 2017-01-02 DIAGNOSIS — Z87891 Personal history of nicotine dependence: Secondary | ICD-10-CM | POA: Diagnosis not present

## 2017-01-02 DIAGNOSIS — I110 Hypertensive heart disease with heart failure: Secondary | ICD-10-CM | POA: Diagnosis not present

## 2017-01-02 DIAGNOSIS — R0602 Shortness of breath: Secondary | ICD-10-CM | POA: Diagnosis present

## 2017-01-02 DIAGNOSIS — R0902 Hypoxemia: Secondary | ICD-10-CM

## 2017-01-02 DIAGNOSIS — I5022 Chronic systolic (congestive) heart failure: Secondary | ICD-10-CM | POA: Insufficient documentation

## 2017-01-02 DIAGNOSIS — I509 Heart failure, unspecified: Secondary | ICD-10-CM

## 2017-01-02 HISTORY — DX: Chronic obstructive pulmonary disease, unspecified: J44.9

## 2017-01-02 LAB — CBC
HEMATOCRIT: 40.4 % (ref 40.0–52.0)
HEMOGLOBIN: 13.8 g/dL (ref 13.0–18.0)
MCH: 32.9 pg (ref 26.0–34.0)
MCHC: 34.2 g/dL (ref 32.0–36.0)
MCV: 96.2 fL (ref 80.0–100.0)
PLATELETS: 199 10*3/uL (ref 150–440)
RBC: 4.2 MIL/uL — ABNORMAL LOW (ref 4.40–5.90)
RDW: 14.1 % (ref 11.5–14.5)
WBC: 9 10*3/uL (ref 3.8–10.6)

## 2017-01-02 LAB — BASIC METABOLIC PANEL
Anion gap: 12 (ref 5–15)
BUN: 29 mg/dL — ABNORMAL HIGH (ref 6–20)
CHLORIDE: 96 mmol/L — AB (ref 101–111)
CO2: 28 mmol/L (ref 22–32)
CREATININE: 1.52 mg/dL — AB (ref 0.61–1.24)
Calcium: 9.4 mg/dL (ref 8.9–10.3)
GFR calc non Af Amer: 45 mL/min — ABNORMAL LOW (ref >60)
GFR, EST AFRICAN AMERICAN: 52 mL/min — AB (ref >60)
Glucose, Bld: 190 mg/dL — ABNORMAL HIGH (ref 65–99)
Potassium: 4 mmol/L (ref 3.5–5.1)
Sodium: 136 mmol/L (ref 135–145)

## 2017-01-02 LAB — BRAIN NATRIURETIC PEPTIDE: B Natriuretic Peptide: 692 pg/mL — ABNORMAL HIGH (ref 0.0–100.0)

## 2017-01-02 LAB — TROPONIN I: Troponin I: <0.03 ng/mL (ref <0.03)

## 2017-01-02 MED ORDER — FUROSEMIDE 10 MG/ML IJ SOLN
40.0000 mg | Freq: Once | INTRAMUSCULAR | Status: AC
  Administered 2017-01-02: 40 mg via INTRAVENOUS
  Filled 2017-01-02: qty 4

## 2017-01-02 MED ORDER — IPRATROPIUM-ALBUTEROL 0.5-2.5 (3) MG/3ML IN SOLN
3.0000 mL | Freq: Once | RESPIRATORY_TRACT | Status: AC
  Administered 2017-01-02: 3 mL via RESPIRATORY_TRACT
  Filled 2017-01-02: qty 3

## 2017-01-02 NOTE — ED Notes (Signed)
Patient given end tidal [place] per MD order

## 2017-01-02 NOTE — ED Triage Notes (Signed)
Pt arrives ambulatory to triage with c/o SOB. Pt has a hard work of breathing and is pael at this time. Pt reports hx of CHF and HTN. Pt is hypoxic at 83% RA at this time in triage.

## 2017-01-02 NOTE — ED Notes (Signed)
Dr. Quale at bedside.  

## 2017-01-02 NOTE — ED Notes (Signed)
Patient transported to X-ray on Doctor, general practice with radiology tech, Homero Fellers

## 2017-01-02 NOTE — ED Notes (Addendum)
[facility] sleeping, denies pain or discomfort.

## 2017-01-02 NOTE — ED Provider Notes (Signed)
[facility] Emergency Department Provider Note ____________________________________________   First MD Initiated Contact with Patient 01/02/17 1944     (approximate)  I have reviewed the triage vital signs and the nursing notes.   HISTORY  Chief Complaint Shortness of Breath  HPI Victor [name] is a 71 y.o. male here for evaluation of shortness of breath  Patient reports he began feeling short of breath today, worse with exertion, relieved somewhat by sitting up. Reports he is not wheezing, but feels like he is "Fluid on lungs". Reports he is admitted to the [facility] in [place] just a few weeks ago for similar symptoms which were diagnosed as "congestive heart failure"  He does take a fluid pill regularly, reports he's been trying to watch his water intake. Is no some slight swelling in both ankles. No chest pain, reports trouble breathing is much better and he is on oxygen.  No nausea or vomiting. Does report he was told he has a history of "COPD" and also previous history of A. fib and hypertension, however reports she is not wheezing and does not feel like a COPD attack  Patient is on apixaban for AFIB. Denies any chest pain, pain on inspiration or previous history of blood clots.  Past Medical History:  Diagnosis Date  . Atrial fibrillation (HCC)   . Chronic systolic CHF (congestive heart failure) (HCC)   . COPD (chronic obstructive pulmonary disease) (HCC)   . Diabetes mellitus without complication (HCC)   . Hyperlipemia   . Hypertension     Patient Active Problem List   Diagnosis Date Noted  . Seizure (HCC) 10/25/2015  . A-fib (HCC) 04/05/2015    Past Surgical History:  Procedure Laterality Date  . TONSILLECTOMY      Prior to Admission medications   Medication Sig Start Date End Date Taking? Authorizing Provider  amLODipine (NORVASC) 10 MG tablet Take 10 mg by mouth daily.   Yes Historical Provider, MD  apixaban (ELIQUIS) 5 MG  TABS tablet Take 5 mg by mouth 2 (two) times daily.   Yes Historical Provider, MD  Bumetanide (BUMEX PO) Take 1 tablet by mouth 2 (two) times daily.   Yes Historical Provider, MD  carvedilol (COREG) 25 MG tablet Take 25 mg by mouth every 12 (twelve) hours.   Yes Historical Provider, MD  folic acid (FOLVITE) 1 MG tablet Take 1 mg by mouth daily.   Yes Historical Provider, MD  glipiZIDE (GLUCOTROL) 10 MG tablet Take 10 mg by mouth daily before breakfast.    Yes Historical Provider, MD  insulin glargine (LANTUS) 100 UNIT/ML injection Inject 20 Units into the skin every evening.    Yes Historical Provider, MD  levETIRAcetam (KEPPRA) 750 MG tablet Take 1 tablet (750 mg total) by mouth 2 (two) times daily. 10/26/15  Yes Altamese Dilling, MD  lisinopril (PRINIVIL,ZESTRIL) 40 MG tablet Take 40 mg by mouth daily.    Yes Historical Provider, MD  Multiple Vitamins-Minerals (MULTIVITAMIN WITH MINERALS) tablet Take 1 tablet by mouth daily.   Yes Historical Provider, MD  potassium chloride SA (K-DUR,KLOR-CON) 20 MEQ tablet Take 20 mEq by mouth 2 (two) times daily.   Yes Historical Provider, MD  simvastatin (ZOCOR) 20 MG tablet Take 20 mg by mouth at bedtime.    Yes Historical Provider, MD  allopurinol (ZYLOPRIM) 100 MG tablet Take 200 mg by mouth daily.    Historical Provider, MD  diltiazem (CARDIZEM) 30 MG tablet Take 1 tablet (30 mg total) by  mouth 3 (three) times daily as needed (For HR > 120). Patient not taking: Reported on 04/28/2016 04/07/15   Houston Siren, MD  magnesium oxide (MAG-OX) 400 MG tablet Take 400 mg by mouth daily.    Historical Provider, MD    Allergies Patient has no known allergies.  Family History  Problem Relation Age of Onset  . Heart failure Mother     deceased    Social History Social History  Substance Use Topics  . Smoking status: Former Games developer  . Smokeless tobacco: Never Used  . Alcohol use No    Review of Systems Constitutional: No fever/chills Eyes: No  visual changes. ENT: No sore throat. Cardiovascular: Denies chest pain. Respiratory: See history of present illness. No productive cough. Gastrointestinal: No abdominal pain.  No nausea, no vomiting.  No diarrhea.  No constipation. Genitourinary: Negative for dysuria. Musculoskeletal: Negative for back pain. Skin: Negative for rash. Neurological: Negative for headaches, focal weakness or numbness.  10-point ROS otherwise negative.  ____________________________________________   PHYSICAL EXAM:  VITAL SIGNS: ED Triage Vitals  Enc Vitals Group     BP 01/02/17 1931 (!) 153/70     Pulse Rate 01/02/17 1931 96     Resp 01/02/17 1931 (!) 24     Temp 01/02/17 1931 98.6 F (37 C)     Temp Source 01/02/17 1931 Oral     SpO2 01/02/17 1931 (!) 83 %     Weight 01/02/17 1931 200 lb (90.7 kg)     Height 01/02/17 1931 6' (1.829 m)     Head Circumference --      Peak Flow --      Pain Score 01/02/17 1930 0     Pain Loc --      Pain Edu? --      Excl. in GC? --     Constitutional: Alert and oriented. Well appearing and in no acute distress.Very pleasant Eyes: Conjunctivae are normal. PERRL. EOMI. Head: Atraumatic. Nose: No congestion/rhinnorhea. Mouth/Throat: Mucous membranes are moist.  Oropharynx non-erythematous. Neck: No stridor.   Cardiovascular: Normal rate, regular rhythm. Grossly normal heart sounds.  Good peripheral circulation. Respiratory: Normal respiratory effort. Diminished lung sounds in the bases bilaterally. No accessory muscle use or retractions. Appears to be respiratory comfortably now on oxygen. Gastrointestinal: Soft and nontender. No distention. No abdominal bruits. No CVA tenderness. Musculoskeletal: No lower extremity tenderness nor edema.   Neurologic:  Normal speech and language. No gross focal neurologic deficits are appreciated.  Skin:  Skin is warm, dry and intact. No rash noted. Psychiatric: Mood and affect are normal. Speech and behavior are  normal.  ____________________________________________   LABS (all labs ordered are listed, but only abnormal results are displayed)  Labs Reviewed  BASIC METABOLIC PANEL - Abnormal; Notable for the following:       Result Value   Chloride 96 (*)    Glucose, Bld 190 (*)    BUN 29 (*)    Creatinine, Ser 1.52 (*)    GFR calc non Af Amer 45 (*)    GFR calc Af Amer 52 (*)    All other components within normal limits  CBC - Abnormal; Notable for the following:    RBC 4.20 (*)    All other components within normal limits  BRAIN NATRIURETIC PEPTIDE - Abnormal; Notable for the following:    B Natriuretic Peptide 692.0 (*)    All other components within normal limits  TROPONIN I   ____________________________________________  EKG  Reviewed  and interpreted by me in 1935 Heart rate 95 QRS 90 QTc 440 Normal sinus rhythm, minimal ST depression noted in the anterior distribution, and slightly in the lateral precordial leads. As compared with patient's previous EKG from 04/05/2015, appears to be a persistent region of depression noted, no obvious evidence of acute ischemic change noted when accounting for possible slight changes in lead position ____________________________________________  RADIOLOGY  Dg Chest 2 View  Result Date: 01/02/2017 CLINICAL DATA:  Pt arrives ambulatory to triage with c/o SOB. Pt has a hard work of breathing and is pael at this time. Pt reports hx of CHF and HTN. Pt is hypoxic at 83% EXAM: CHEST  2 VIEW COMPARISON:  04/05/2015 FINDINGS: Cardiac silhouette is borderline enlarged. No mediastinal or hilar masses. No convincing adenopathy. Opacity extending along the bronchovascular structures most evident to the medial lower lobes is consistent with bronchial wall thickening, with this finding increased when compared to the prior study. Acute bronchial infection/ inflammation should be considered. There are no focal areas of lung consolidation. There is no convincing  pulmonary edema. No pleural effusion or pneumothorax. Skeletal structures are intact. IMPRESSION: 1. No convincing pulmonary edema. 2. Bronchial wall thickening most evident to the medial lower lobes. Acute bronchitis is suspected. No evidence of lobar pneumonia. Electronically Signed   By: Amie Portland M.D.   On: 01/02/2017 19:58    ____________________________________________   PROCEDURES  Procedure(s) performed: None  Procedures  Critical Care performed: No  ____________________________________________   INITIAL IMPRESSION / ASSESSMENT AND PLAN / ED COURSE  Pertinent labs & imaging results that were available during my care of the patient were reviewed by me and considered in my medical decision making (see chart for details).  Patient branch for evaluation dyspnea. He does have hypoxia on room air, but corrects well on 2 L nasal cannula and reports significant improvement in symptoms. Slight peripheral edema, reportedly worsening with the recent treatment for CHF. Denies chest pain, no pleuritic pain, and he is anticoagulated making likelihood of an acute coronary syndrome or pulmonary embolism seemingly low given his clinical history. Seems to most fit a picture of congestive heart failure, chest x-ray with a slight bronchitic picture but the patient denies any infectious symptoms or fever. No elevation of his white count. Blood pressure is elevated, we will treat him with Lasix and monitor him closely. In addition, patient was given a nebulizer treatment, and reported slight improvement in symptoms but I suspect this is being driven by congestive heart failure.  ----------------------------------------- 9:34 PM on 01/02/2017 -----------------------------------------  Patient resting comfortably. Patient does wish to go to the [facility] for ongoing care, have completed the [place] transfer packet and submitted. Patient alert, able to ambulate and appearing improved. Ongoing care  assigned to Dr. Don Perking. Patient with probable CHF, treating with Lasix) monitoring awaiting plan for transfer to [facility] as he remains stable.        ____________________________________________   FINAL CLINICAL IMPRESSION(S) / ED DIAGNOSES  Final diagnoses:  Hypoxia  Congenital heart failure (HCC)      NEW MEDICATIONS STARTED DURING THIS VISIT:  New Prescriptions   No medications on file     Note:  This document was prepared using Dragon voice recognition software and may include unintentional dictation errors.     Sharyn Creamer, MD 01/02/17 2135

## 2017-02-22 ENCOUNTER — Other Ambulatory Visit: Payer: Self-pay | Admitting: *Deleted

## 2017-02-22 NOTE — Patient Outreach (Addendum)
[facility] THN Screening call, unsuccessful but left a message for a return call. If I do not hear back from the member I will try again within the week.  Victor Councilarroll C. Burgess EstelleSpinks, MSN, GNP-BC Gerontological Nurse Practitioner [facility] Care Management [phone]  Pt returned my call. He does not have a primary care provider [facility]. He has gone to the ED possibly 4 times in the last 6 months and by his description he has done so because he could not get a timely visit with his [facility] provider. His provider there is Coral SpikesJacqueline [name], NP. He has several specialists he sees at the [facility] also including a cardiologist and dermatologist. He has also been admitted one time to the [facility] hospital in the last 6 months.  Pt has multiple co-morbidites including: diabetes, CHF, COPD, HTN, AFIB.  He does not check his glucose regularly and does not know what his last Hgb A1C level was. He states he does weigh daily and his weight is stable.  I asked if I could refer him to a community care Production designer, theatre/television/filmmanager and he agreed. I also talked to him about contacting the [facility] concierge to get a local MD assigned so he can get local treatment in a more timely fashion when needed. Pt states he will do this, so follow up to ensure he got a local primary care assigned and scheduled is necessary.  He needs education on diabetes, CHF and COPD.  I will make the referral for Community Care Management.  Victor Councilarroll C. Burgess EstelleSpinks, MSN, [facility] CountyGNP-BC Gerontological Nurse Practitioner [facility] Care Management [phone]

## 2017-02-23 ENCOUNTER — Encounter: Payer: Self-pay | Admitting: *Deleted

## 2017-02-23 NOTE — Addendum Note (Signed)
Addended by: Almetta LovelySPINKS, Cleone Hulick on: 02/23/2017 10:07 AM   Modules accepted: Orders

## 2017-02-25 ENCOUNTER — Other Ambulatory Visit: Payer: Self-pay | Admitting: *Deleted

## 2017-02-25 NOTE — Patient Outreach (Signed)
Received a return phone call from pt to voice message left earlier today.  Spoke with pt, HIPAA identifiers provided, discussed purpose of attempt call earlier, follow up on referral from coworker Noralyn Pickarroll NP to follow with community nurse case management services.   RN CM inquired of pt about calling [facility] [employer] to have local primary care MD assigned to which pt reports waiting for an email from [employer]- to put a list together of doctors, need to pick one.  RN CM discussed doing a home visit to which pt agreed.   Plan:  As discussed, plan to follow up with pt next week- initial home visit.    Shayne Alkenose M.   Alin Chavira RN CCM [facility] Care Management  [phone]

## 2017-02-25 NOTE — Patient Outreach (Signed)
Unsuccessful telephone outreach, attempt made to contact pt and follow up on [facility]  Almetta Lovelyarroll Spinks NP for community care management services.   HIPAA compliant voice message left with contact name and number.     Plan:  If no response to voice message left, plan to follow up again within next 4 days.    Shayne Alkenose M.   Khandi Kernes RN CCM [facility] Care Management  [phone]

## 2017-03-04 ENCOUNTER — Encounter: Payer: Self-pay | Admitting: *Deleted

## 2017-03-04 ENCOUNTER — Other Ambulatory Visit: Payer: Self-pay | Admitting: *Deleted
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
## 2017-04-06 ENCOUNTER — Other Ambulatory Visit: Payer: Self-pay | Admitting: *Deleted
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
## 2017-05-07 ENCOUNTER — Other Ambulatory Visit: Payer: Self-pay | Admitting: *Deleted
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
## 2017-05-14 ENCOUNTER — Other Ambulatory Visit: Payer: Self-pay | Admitting: *Deleted

## 2017-05-14 NOTE — Patient Outreach (Signed)
Unsuccessful telephone encounter to Victor [name], 71 year old male for transition of care, ongoing follow up on recent hospitalization as informed by pt 05/07/17 [facility] [place] [facility] [place] [facility] for CH[place].   HIPAA compliant voice message left with contact name and number.    Plan:  If no response to voice message left, plan to follow up again next week as part of ongoing transition of care.   Shayne Alkenose M.   Linda Grimmer RN CCM [facility] Care Management  [phone]

## 2017-05-18 ENCOUNTER — Other Ambulatory Visit: Payer: Self-pay | Admitting: *Deleted
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
## 2017-05-18 NOTE — Patient Outreach (Signed)
Second unsuccessful telephone encounter to Victor [name], 71 year old male for transition of care,ongoing follow up on recent hospitalization as informed by pt during August [place],2018 [place] [facility] [place] August 21-28,2018 for CHF.   HIPPA compliant voice message left with contact name and number.    Plan:  If no response to voice message left, plan to follow up again tomorrow telephonically.     Shayne Alkenose M.   Kennadie Brenner RN CCM [facility] Care Management  [phone]

## 2017-05-21 ENCOUNTER — Ambulatory Visit: Payer: Self-pay | Admitting: *Deleted

## 2017-05-25 ENCOUNTER — Ambulatory Visit: Payer: Self-pay | Admitting: *Deleted

## 2017-05-25 ENCOUNTER — Other Ambulatory Visit: Payer: Self-pay | Admitting: *Deleted

## 2017-05-25 NOTE — Patient Outreach (Signed)
Unsuccessful telephone encounter to Victor [name], 71 year old male for transition of care/ongoing follow up on recent hospitalization at [facility] in  [facility]  August 21-28,2018 for CHF.   HIPAA compliant voice message left with contact name and number.    Plan:  If no response to voice message left, plan to follow up again within next 2 days.    Shayne Alken.   Pierzchala RN CCM [facility] Care Management  [phone]

## 2017-05-26 ENCOUNTER — Other Ambulatory Visit: Payer: Self-pay | Admitting: *Deleted

## 2017-05-26 NOTE — Patient Outreach (Signed)
Second unsuccessful telephone encounter to Caedmon Louque, 71 year old male for transition of care/ongoing follow up on recent hospitalization at [facility]  August 21-28,2018 for CHF.   HIPAA compliant voice message left with contact name and number.     Plan: If no response to voice message left, plan to follow up again next week telephonically.    Shayne Alken.   Pierzchala RN CCM [facility] Care Management  [phone]

## 2017-05-28 ENCOUNTER — Ambulatory Visit: Payer: Self-pay | Admitting: *Deleted

## 2017-05-31 ENCOUNTER — Other Ambulatory Visit: Payer: Self-pay | Admitting: *Deleted
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
## 2017-06-01 ENCOUNTER — Encounter: Payer: Self-pay | Admitting: *Deleted

## 2017-06-02 ENCOUNTER — Ambulatory Visit: Payer: Self-pay | Admitting: *Deleted

## 2017-06-07 ENCOUNTER — Ambulatory Visit: Payer: Self-pay | Admitting: *Deleted

## 2017-06-09 ENCOUNTER — Other Ambulatory Visit: Payer: Self-pay | Admitting: *Deleted
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
## 2017-07-02 ENCOUNTER — Encounter: Payer: Self-pay | Admitting: *Deleted

## 2017-07-02 ENCOUNTER — Other Ambulatory Visit: Payer: Self-pay | Admitting: *Deleted
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
## 2018-01-31 DIAGNOSIS — I10 Essential (primary) hypertension: Secondary | ICD-10-CM | POA: Diagnosis not present

## 2018-01-31 DIAGNOSIS — M50323 Other cervical disc degeneration at C6-C7 level: Secondary | ICD-10-CM | POA: Diagnosis not present

## 2018-01-31 DIAGNOSIS — M50322 Other cervical disc degeneration at C5-C6 level: Secondary | ICD-10-CM | POA: Diagnosis not present

## 2018-01-31 DIAGNOSIS — J40 Bronchitis, not specified as acute or chronic: Secondary | ICD-10-CM | POA: Diagnosis not present

## 2018-01-31 DIAGNOSIS — R918 Other nonspecific abnormal finding of lung field: Secondary | ICD-10-CM | POA: Diagnosis not present

## 2018-01-31 DIAGNOSIS — E871 Hypo-osmolality and hyponatremia: Secondary | ICD-10-CM | POA: Diagnosis not present

## 2018-01-31 DIAGNOSIS — M47892 Other spondylosis, cervical region: Secondary | ICD-10-CM | POA: Diagnosis not present

## 2018-01-31 DIAGNOSIS — Z794 Long term (current) use of insulin: Secondary | ICD-10-CM | POA: Diagnosis not present

## 2018-01-31 DIAGNOSIS — R0602 Shortness of breath: Secondary | ICD-10-CM | POA: Diagnosis not present

## 2018-01-31 DIAGNOSIS — S199XXA Unspecified injury of neck, initial encounter: Secondary | ICD-10-CM | POA: Diagnosis not present

## 2018-01-31 DIAGNOSIS — S0990XA Unspecified injury of head, initial encounter: Secondary | ICD-10-CM | POA: Diagnosis not present

## 2018-01-31 DIAGNOSIS — R55 Syncope and collapse: Secondary | ICD-10-CM | POA: Diagnosis not present

## 2018-01-31 DIAGNOSIS — R7889 Finding of other specified substances, not normally found in blood: Secondary | ICD-10-CM | POA: Diagnosis not present

## 2018-01-31 DIAGNOSIS — I340 Nonrheumatic mitral (valve) insufficiency: Secondary | ICD-10-CM | POA: Diagnosis not present

## 2018-01-31 DIAGNOSIS — Z66 Do not resuscitate: Secondary | ICD-10-CM | POA: Diagnosis not present

## 2018-01-31 DIAGNOSIS — M1990 Unspecified osteoarthritis, unspecified site: Secondary | ICD-10-CM | POA: Diagnosis not present

## 2018-01-31 DIAGNOSIS — M8580 Other specified disorders of bone density and structure, unspecified site: Secondary | ICD-10-CM | POA: Diagnosis not present

## 2018-01-31 DIAGNOSIS — I110 Hypertensive heart disease with heart failure: Secondary | ICD-10-CM | POA: Diagnosis not present

## 2018-01-31 DIAGNOSIS — Z9181 History of falling: Secondary | ICD-10-CM | POA: Diagnosis not present

## 2018-01-31 DIAGNOSIS — E1165 Type 2 diabetes mellitus with hyperglycemia: Secondary | ICD-10-CM | POA: Diagnosis not present

## 2018-01-31 DIAGNOSIS — G8929 Other chronic pain: Secondary | ICD-10-CM | POA: Diagnosis not present

## 2018-01-31 DIAGNOSIS — R269 Unspecified abnormalities of gait and mobility: Secondary | ICD-10-CM | POA: Diagnosis not present

## 2018-01-31 DIAGNOSIS — Z7901 Long term (current) use of anticoagulants: Secondary | ICD-10-CM | POA: Diagnosis not present

## 2018-01-31 DIAGNOSIS — J449 Chronic obstructive pulmonary disease, unspecified: Secondary | ICD-10-CM | POA: Diagnosis not present

## 2018-01-31 DIAGNOSIS — R569 Unspecified convulsions: Secondary | ICD-10-CM | POA: Diagnosis not present

## 2018-01-31 DIAGNOSIS — I482 Chronic atrial fibrillation: Secondary | ICD-10-CM | POA: Diagnosis not present

## 2018-01-31 DIAGNOSIS — Z87891 Personal history of nicotine dependence: Secondary | ICD-10-CM | POA: Diagnosis not present

## 2018-01-31 DIAGNOSIS — I509 Heart failure, unspecified: Secondary | ICD-10-CM | POA: Diagnosis not present

## 2018-01-31 DIAGNOSIS — E1140 Type 2 diabetes mellitus with diabetic neuropathy, unspecified: Secondary | ICD-10-CM | POA: Diagnosis not present

## 2018-01-31 DIAGNOSIS — M160 Bilateral primary osteoarthritis of hip: Secondary | ICD-10-CM | POA: Diagnosis not present

## 2018-01-31 DIAGNOSIS — Z79899 Other long term (current) drug therapy: Secondary | ICD-10-CM | POA: Diagnosis not present

## 2018-01-31 DIAGNOSIS — G40909 Epilepsy, unspecified, not intractable, without status epilepticus: Secondary | ICD-10-CM | POA: Diagnosis not present

## 2018-01-31 DIAGNOSIS — I071 Rheumatic tricuspid insufficiency: Secondary | ICD-10-CM | POA: Diagnosis not present

## 2018-01-31 DIAGNOSIS — I517 Cardiomegaly: Secondary | ICD-10-CM | POA: Diagnosis not present

## 2018-01-31 DIAGNOSIS — S3993XA Unspecified injury of pelvis, initial encounter: Secondary | ICD-10-CM | POA: Diagnosis not present

## 2018-01-31 DIAGNOSIS — M549 Dorsalgia, unspecified: Secondary | ICD-10-CM | POA: Diagnosis not present

## 2018-01-31 DIAGNOSIS — I4891 Unspecified atrial fibrillation: Secondary | ICD-10-CM | POA: Diagnosis not present

## 2018-01-31 DIAGNOSIS — F10239 Alcohol dependence with withdrawal, unspecified: Secondary | ICD-10-CM | POA: Diagnosis not present

## 2018-01-31 DIAGNOSIS — M25552 Pain in left hip: Secondary | ICD-10-CM | POA: Diagnosis not present

## 2018-01-31 DIAGNOSIS — Z8673 Personal history of transient ischemic attack (TIA), and cerebral infarction without residual deficits: Secondary | ICD-10-CM | POA: Diagnosis not present

## 2018-01-31 DIAGNOSIS — F1020 Alcohol dependence, uncomplicated: Secondary | ICD-10-CM | POA: Diagnosis not present

## 2018-01-31 DIAGNOSIS — M545 Low back pain: Secondary | ICD-10-CM | POA: Diagnosis not present

## 2018-01-31 DIAGNOSIS — G9389 Other specified disorders of brain: Secondary | ICD-10-CM | POA: Diagnosis not present

## 2018-02-07 DIAGNOSIS — Z79899 Other long term (current) drug therapy: Secondary | ICD-10-CM | POA: Diagnosis not present

## 2018-02-07 DIAGNOSIS — I10 Essential (primary) hypertension: Secondary | ICD-10-CM | POA: Diagnosis not present

## 2018-02-07 DIAGNOSIS — R262 Difficulty in walking, not elsewhere classified: Secondary | ICD-10-CM | POA: Diagnosis not present

## 2018-02-07 DIAGNOSIS — B961 Klebsiella pneumoniae [K. pneumoniae] as the cause of diseases classified elsewhere: Secondary | ICD-10-CM | POA: Diagnosis not present

## 2018-02-07 DIAGNOSIS — M4647 Discitis, unspecified, lumbosacral region: Secondary | ICD-10-CM | POA: Diagnosis not present

## 2018-02-07 DIAGNOSIS — E119 Type 2 diabetes mellitus without complications: Secondary | ICD-10-CM | POA: Diagnosis not present

## 2018-02-07 DIAGNOSIS — I959 Hypotension, unspecified: Secondary | ICD-10-CM | POA: Diagnosis not present

## 2018-02-07 DIAGNOSIS — R569 Unspecified convulsions: Secondary | ICD-10-CM | POA: Diagnosis not present

## 2018-02-07 DIAGNOSIS — Z794 Long term (current) use of insulin: Secondary | ICD-10-CM | POA: Diagnosis not present

## 2018-02-07 DIAGNOSIS — Z8673 Personal history of transient ischemic attack (TIA), and cerebral infarction without residual deficits: Secondary | ICD-10-CM | POA: Diagnosis not present

## 2018-02-07 DIAGNOSIS — N3001 Acute cystitis with hematuria: Secondary | ICD-10-CM | POA: Diagnosis not present

## 2018-02-07 DIAGNOSIS — J9811 Atelectasis: Secondary | ICD-10-CM | POA: Diagnosis not present

## 2018-02-07 DIAGNOSIS — Z9181 History of falling: Secondary | ICD-10-CM | POA: Diagnosis not present

## 2018-02-07 DIAGNOSIS — G319 Degenerative disease of nervous system, unspecified: Secondary | ICD-10-CM | POA: Diagnosis not present

## 2018-02-07 DIAGNOSIS — I639 Cerebral infarction, unspecified: Secondary | ICD-10-CM | POA: Diagnosis not present

## 2018-02-07 DIAGNOSIS — N3000 Acute cystitis without hematuria: Secondary | ICD-10-CM | POA: Diagnosis not present

## 2018-02-07 DIAGNOSIS — G8929 Other chronic pain: Secondary | ICD-10-CM | POA: Diagnosis not present

## 2018-02-07 DIAGNOSIS — E8809 Other disorders of plasma-protein metabolism, not elsewhere classified: Secondary | ICD-10-CM | POA: Diagnosis not present

## 2018-02-07 DIAGNOSIS — T82848A Pain from vascular prosthetic devices, implants and grafts, initial encounter: Secondary | ICD-10-CM | POA: Diagnosis not present

## 2018-02-07 DIAGNOSIS — I5030 Unspecified diastolic (congestive) heart failure: Secondary | ICD-10-CM | POA: Diagnosis not present

## 2018-02-07 DIAGNOSIS — Z87891 Personal history of nicotine dependence: Secondary | ICD-10-CM | POA: Diagnosis not present

## 2018-02-07 DIAGNOSIS — I5032 Chronic diastolic (congestive) heart failure: Secondary | ICD-10-CM | POA: Diagnosis not present

## 2018-02-07 DIAGNOSIS — I4891 Unspecified atrial fibrillation: Secondary | ICD-10-CM | POA: Diagnosis not present

## 2018-02-07 DIAGNOSIS — Z7951 Long term (current) use of inhaled steroids: Secondary | ICD-10-CM | POA: Diagnosis not present

## 2018-02-07 DIAGNOSIS — R61 Generalized hyperhidrosis: Secondary | ICD-10-CM | POA: Diagnosis not present

## 2018-02-07 DIAGNOSIS — M4697 Unspecified inflammatory spondylopathy, lumbosacral region: Secondary | ICD-10-CM | POA: Diagnosis not present

## 2018-02-07 DIAGNOSIS — Z7901 Long term (current) use of anticoagulants: Secondary | ICD-10-CM | POA: Diagnosis not present

## 2018-02-07 DIAGNOSIS — R4020 Unspecified coma: Secondary | ICD-10-CM | POA: Diagnosis not present

## 2018-02-07 DIAGNOSIS — I480 Paroxysmal atrial fibrillation: Secondary | ICD-10-CM | POA: Diagnosis not present

## 2018-02-07 DIAGNOSIS — R531 Weakness: Secondary | ICD-10-CM | POA: Diagnosis not present

## 2018-02-07 DIAGNOSIS — M62838 Other muscle spasm: Secondary | ICD-10-CM | POA: Diagnosis not present

## 2018-02-07 DIAGNOSIS — R269 Unspecified abnormalities of gait and mobility: Secondary | ICD-10-CM | POA: Diagnosis not present

## 2018-02-07 DIAGNOSIS — M549 Dorsalgia, unspecified: Secondary | ICD-10-CM | POA: Diagnosis not present

## 2018-02-07 DIAGNOSIS — J449 Chronic obstructive pulmonary disease, unspecified: Secondary | ICD-10-CM | POA: Diagnosis not present

## 2018-02-07 DIAGNOSIS — R55 Syncope and collapse: Secondary | ICD-10-CM | POA: Diagnosis not present

## 2018-02-07 DIAGNOSIS — E1142 Type 2 diabetes mellitus with diabetic polyneuropathy: Secondary | ICD-10-CM | POA: Diagnosis not present

## 2018-02-07 DIAGNOSIS — E871 Hypo-osmolality and hyponatremia: Secondary | ICD-10-CM | POA: Diagnosis not present

## 2018-02-07 DIAGNOSIS — G40909 Epilepsy, unspecified, not intractable, without status epilepticus: Secondary | ICD-10-CM | POA: Diagnosis not present

## 2018-02-07 DIAGNOSIS — I110 Hypertensive heart disease with heart failure: Secondary | ICD-10-CM | POA: Diagnosis not present

## 2018-02-09 DIAGNOSIS — Z794 Long term (current) use of insulin: Secondary | ICD-10-CM | POA: Diagnosis not present

## 2018-02-09 DIAGNOSIS — I10 Essential (primary) hypertension: Secondary | ICD-10-CM | POA: Diagnosis not present

## 2018-02-09 DIAGNOSIS — R262 Difficulty in walking, not elsewhere classified: Secondary | ICD-10-CM | POA: Diagnosis not present

## 2018-02-09 DIAGNOSIS — E1142 Type 2 diabetes mellitus with diabetic polyneuropathy: Secondary | ICD-10-CM | POA: Diagnosis not present

## 2018-02-09 DIAGNOSIS — R569 Unspecified convulsions: Secondary | ICD-10-CM | POA: Diagnosis not present

## 2018-02-09 DIAGNOSIS — I5030 Unspecified diastolic (congestive) heart failure: Secondary | ICD-10-CM | POA: Diagnosis not present

## 2018-02-09 DIAGNOSIS — I110 Hypertensive heart disease with heart failure: Secondary | ICD-10-CM | POA: Diagnosis not present

## 2018-02-23 DIAGNOSIS — E8809 Other disorders of plasma-protein metabolism, not elsewhere classified: Secondary | ICD-10-CM | POA: Diagnosis not present

## 2018-02-23 DIAGNOSIS — M62838 Other muscle spasm: Secondary | ICD-10-CM | POA: Diagnosis not present

## 2018-02-23 DIAGNOSIS — N3001 Acute cystitis with hematuria: Secondary | ICD-10-CM | POA: Diagnosis not present

## 2018-03-06 DIAGNOSIS — R42 Dizziness and giddiness: Secondary | ICD-10-CM | POA: Diagnosis not present

## 2018-03-06 DIAGNOSIS — R279 Unspecified lack of coordination: Secondary | ICD-10-CM | POA: Diagnosis not present

## 2018-03-06 DIAGNOSIS — R55 Syncope and collapse: Secondary | ICD-10-CM | POA: Diagnosis not present

## 2018-03-06 DIAGNOSIS — Z7901 Long term (current) use of anticoagulants: Secondary | ICD-10-CM | POA: Diagnosis not present

## 2018-03-06 DIAGNOSIS — Z7951 Long term (current) use of inhaled steroids: Secondary | ICD-10-CM | POA: Diagnosis not present

## 2018-03-06 DIAGNOSIS — G40909 Epilepsy, unspecified, not intractable, without status epilepticus: Secondary | ICD-10-CM | POA: Diagnosis not present

## 2018-03-06 DIAGNOSIS — B961 Klebsiella pneumoniae [K. pneumoniae] as the cause of diseases classified elsewhere: Secondary | ICD-10-CM | POA: Diagnosis not present

## 2018-03-06 DIAGNOSIS — M7989 Other specified soft tissue disorders: Secondary | ICD-10-CM | POA: Diagnosis not present

## 2018-03-06 DIAGNOSIS — M4646 Discitis, unspecified, lumbar region: Secondary | ICD-10-CM | POA: Diagnosis not present

## 2018-03-06 DIAGNOSIS — I959 Hypotension, unspecified: Secondary | ICD-10-CM | POA: Diagnosis not present

## 2018-03-06 DIAGNOSIS — M4647 Discitis, unspecified, lumbosacral region: Secondary | ICD-10-CM | POA: Diagnosis not present

## 2018-03-06 DIAGNOSIS — R2689 Other abnormalities of gait and mobility: Secondary | ICD-10-CM | POA: Diagnosis not present

## 2018-03-06 DIAGNOSIS — I639 Cerebral infarction, unspecified: Secondary | ICD-10-CM | POA: Diagnosis not present

## 2018-03-06 DIAGNOSIS — R488 Other symbolic dysfunctions: Secondary | ICD-10-CM | POA: Diagnosis not present

## 2018-03-06 DIAGNOSIS — J449 Chronic obstructive pulmonary disease, unspecified: Secondary | ICD-10-CM | POA: Diagnosis not present

## 2018-03-06 DIAGNOSIS — M4626 Osteomyelitis of vertebra, lumbar region: Secondary | ICD-10-CM | POA: Diagnosis not present

## 2018-03-06 DIAGNOSIS — R531 Weakness: Secondary | ICD-10-CM | POA: Diagnosis not present

## 2018-03-06 DIAGNOSIS — M6281 Muscle weakness (generalized): Secondary | ICD-10-CM | POA: Diagnosis not present

## 2018-03-06 DIAGNOSIS — I10 Essential (primary) hypertension: Secondary | ICD-10-CM | POA: Diagnosis not present

## 2018-03-06 DIAGNOSIS — L03114 Cellulitis of left upper limb: Secondary | ICD-10-CM | POA: Diagnosis not present

## 2018-03-06 DIAGNOSIS — R748 Abnormal levels of other serum enzymes: Secondary | ICD-10-CM | POA: Diagnosis not present

## 2018-03-06 DIAGNOSIS — Z794 Long term (current) use of insulin: Secondary | ICD-10-CM | POA: Diagnosis not present

## 2018-03-06 DIAGNOSIS — Z452 Encounter for adjustment and management of vascular access device: Secondary | ICD-10-CM | POA: Diagnosis not present

## 2018-03-06 DIAGNOSIS — I6529 Occlusion and stenosis of unspecified carotid artery: Secondary | ICD-10-CM | POA: Diagnosis not present

## 2018-03-06 DIAGNOSIS — I110 Hypertensive heart disease with heart failure: Secondary | ICD-10-CM | POA: Diagnosis not present

## 2018-03-06 DIAGNOSIS — M109 Gout, unspecified: Secondary | ICD-10-CM | POA: Diagnosis not present

## 2018-03-06 DIAGNOSIS — M549 Dorsalgia, unspecified: Secondary | ICD-10-CM | POA: Diagnosis not present

## 2018-03-06 DIAGNOSIS — T82848A Pain from vascular prosthetic devices, implants and grafts, initial encounter: Secondary | ICD-10-CM | POA: Diagnosis not present

## 2018-03-06 DIAGNOSIS — Z8673 Personal history of transient ischemic attack (TIA), and cerebral infarction without residual deficits: Secondary | ICD-10-CM | POA: Diagnosis not present

## 2018-03-06 DIAGNOSIS — M25532 Pain in left wrist: Secondary | ICD-10-CM | POA: Diagnosis not present

## 2018-03-06 DIAGNOSIS — N3000 Acute cystitis without hematuria: Secondary | ICD-10-CM | POA: Diagnosis not present

## 2018-03-06 DIAGNOSIS — R4020 Unspecified coma: Secondary | ICD-10-CM | POA: Diagnosis not present

## 2018-03-06 DIAGNOSIS — R609 Edema, unspecified: Secondary | ICD-10-CM | POA: Diagnosis not present

## 2018-03-06 DIAGNOSIS — M4627 Osteomyelitis of vertebra, lumbosacral region: Secondary | ICD-10-CM | POA: Diagnosis not present

## 2018-03-06 DIAGNOSIS — E119 Type 2 diabetes mellitus without complications: Secondary | ICD-10-CM | POA: Diagnosis not present

## 2018-03-06 DIAGNOSIS — J9811 Atelectasis: Secondary | ICD-10-CM | POA: Diagnosis not present

## 2018-03-06 DIAGNOSIS — I480 Paroxysmal atrial fibrillation: Secondary | ICD-10-CM | POA: Diagnosis not present

## 2018-03-06 DIAGNOSIS — N3090 Cystitis, unspecified without hematuria: Secondary | ICD-10-CM | POA: Diagnosis not present

## 2018-03-06 DIAGNOSIS — E1142 Type 2 diabetes mellitus with diabetic polyneuropathy: Secondary | ICD-10-CM | POA: Diagnosis not present

## 2018-03-06 DIAGNOSIS — R61 Generalized hyperhidrosis: Secondary | ICD-10-CM | POA: Diagnosis not present

## 2018-03-06 DIAGNOSIS — R278 Other lack of coordination: Secondary | ICD-10-CM | POA: Diagnosis not present

## 2018-03-06 DIAGNOSIS — G319 Degenerative disease of nervous system, unspecified: Secondary | ICD-10-CM | POA: Diagnosis not present

## 2018-03-06 DIAGNOSIS — I517 Cardiomegaly: Secondary | ICD-10-CM | POA: Diagnosis not present

## 2018-03-06 DIAGNOSIS — Z79899 Other long term (current) drug therapy: Secondary | ICD-10-CM | POA: Diagnosis not present

## 2018-03-06 DIAGNOSIS — M5126 Other intervertebral disc displacement, lumbar region: Secondary | ICD-10-CM | POA: Diagnosis not present

## 2018-03-06 DIAGNOSIS — M47896 Other spondylosis, lumbar region: Secondary | ICD-10-CM | POA: Diagnosis not present

## 2018-03-06 DIAGNOSIS — Z743 Need for continuous supervision: Secondary | ICD-10-CM | POA: Diagnosis not present

## 2018-03-06 DIAGNOSIS — M5136 Other intervertebral disc degeneration, lumbar region: Secondary | ICD-10-CM | POA: Diagnosis not present

## 2018-03-06 DIAGNOSIS — I5032 Chronic diastolic (congestive) heart failure: Secondary | ICD-10-CM | POA: Diagnosis not present

## 2018-03-06 DIAGNOSIS — M4697 Unspecified inflammatory spondylopathy, lumbosacral region: Secondary | ICD-10-CM | POA: Diagnosis not present

## 2018-03-06 DIAGNOSIS — M4640 Discitis, unspecified, site unspecified: Secondary | ICD-10-CM | POA: Diagnosis not present

## 2018-03-06 DIAGNOSIS — Z87891 Personal history of nicotine dependence: Secondary | ICD-10-CM | POA: Diagnosis not present

## 2018-03-21 DIAGNOSIS — R279 Unspecified lack of coordination: Secondary | ICD-10-CM | POA: Diagnosis not present

## 2018-03-21 DIAGNOSIS — M6281 Muscle weakness (generalized): Secondary | ICD-10-CM | POA: Diagnosis not present

## 2018-03-21 DIAGNOSIS — J9811 Atelectasis: Secondary | ICD-10-CM | POA: Diagnosis not present

## 2018-03-21 DIAGNOSIS — Z743 Need for continuous supervision: Secondary | ICD-10-CM | POA: Diagnosis not present

## 2018-03-21 DIAGNOSIS — R590 Localized enlarged lymph nodes: Secondary | ICD-10-CM | POA: Diagnosis not present

## 2018-03-21 DIAGNOSIS — I5030 Unspecified diastolic (congestive) heart failure: Secondary | ICD-10-CM | POA: Diagnosis not present

## 2018-03-21 DIAGNOSIS — K5900 Constipation, unspecified: Secondary | ICD-10-CM | POA: Diagnosis not present

## 2018-03-21 DIAGNOSIS — E119 Type 2 diabetes mellitus without complications: Secondary | ICD-10-CM | POA: Diagnosis not present

## 2018-03-21 DIAGNOSIS — N179 Acute kidney failure, unspecified: Secondary | ICD-10-CM | POA: Diagnosis not present

## 2018-03-21 DIAGNOSIS — Z7951 Long term (current) use of inhaled steroids: Secondary | ICD-10-CM | POA: Diagnosis not present

## 2018-03-21 DIAGNOSIS — J90 Pleural effusion, not elsewhere classified: Secondary | ICD-10-CM | POA: Diagnosis not present

## 2018-03-21 DIAGNOSIS — I5033 Acute on chronic diastolic (congestive) heart failure: Secondary | ICD-10-CM | POA: Diagnosis not present

## 2018-03-21 DIAGNOSIS — R2689 Other abnormalities of gait and mobility: Secondary | ICD-10-CM | POA: Diagnosis not present

## 2018-03-21 DIAGNOSIS — R609 Edema, unspecified: Secondary | ICD-10-CM | POA: Diagnosis not present

## 2018-03-21 DIAGNOSIS — Z8673 Personal history of transient ischemic attack (TIA), and cerebral infarction without residual deficits: Secondary | ICD-10-CM | POA: Diagnosis not present

## 2018-03-21 DIAGNOSIS — Z452 Encounter for adjustment and management of vascular access device: Secondary | ICD-10-CM | POA: Diagnosis not present

## 2018-03-21 DIAGNOSIS — Z794 Long term (current) use of insulin: Secondary | ICD-10-CM | POA: Diagnosis not present

## 2018-03-21 DIAGNOSIS — N183 Chronic kidney disease, stage 3 (moderate): Secondary | ICD-10-CM | POA: Diagnosis not present

## 2018-03-21 DIAGNOSIS — I5032 Chronic diastolic (congestive) heart failure: Secondary | ICD-10-CM | POA: Diagnosis not present

## 2018-03-21 DIAGNOSIS — N3090 Cystitis, unspecified without hematuria: Secondary | ICD-10-CM | POA: Diagnosis not present

## 2018-03-21 DIAGNOSIS — I639 Cerebral infarction, unspecified: Secondary | ICD-10-CM | POA: Diagnosis not present

## 2018-03-21 DIAGNOSIS — M4646 Discitis, unspecified, lumbar region: Secondary | ICD-10-CM | POA: Diagnosis not present

## 2018-03-21 DIAGNOSIS — M4626 Osteomyelitis of vertebra, lumbar region: Secondary | ICD-10-CM | POA: Diagnosis not present

## 2018-03-21 DIAGNOSIS — Z87891 Personal history of nicotine dependence: Secondary | ICD-10-CM | POA: Diagnosis not present

## 2018-03-21 DIAGNOSIS — R600 Localized edema: Secondary | ICD-10-CM | POA: Diagnosis not present

## 2018-03-21 DIAGNOSIS — I517 Cardiomegaly: Secondary | ICD-10-CM | POA: Diagnosis not present

## 2018-03-21 DIAGNOSIS — J449 Chronic obstructive pulmonary disease, unspecified: Secondary | ICD-10-CM | POA: Diagnosis not present

## 2018-03-21 DIAGNOSIS — Z7901 Long term (current) use of anticoagulants: Secondary | ICD-10-CM | POA: Diagnosis not present

## 2018-03-21 DIAGNOSIS — M4647 Discitis, unspecified, lumbosacral region: Secondary | ICD-10-CM | POA: Diagnosis not present

## 2018-03-21 DIAGNOSIS — J441 Chronic obstructive pulmonary disease with (acute) exacerbation: Secondary | ICD-10-CM | POA: Diagnosis not present

## 2018-03-21 DIAGNOSIS — K5909 Other constipation: Secondary | ICD-10-CM | POA: Diagnosis not present

## 2018-03-21 DIAGNOSIS — I6529 Occlusion and stenosis of unspecified carotid artery: Secondary | ICD-10-CM | POA: Diagnosis not present

## 2018-03-21 DIAGNOSIS — Z79899 Other long term (current) drug therapy: Secondary | ICD-10-CM | POA: Diagnosis not present

## 2018-03-21 DIAGNOSIS — J9601 Acute respiratory failure with hypoxia: Secondary | ICD-10-CM | POA: Diagnosis not present

## 2018-03-21 DIAGNOSIS — I130 Hypertensive heart and chronic kidney disease with heart failure and stage 1 through stage 4 chronic kidney disease, or unspecified chronic kidney disease: Secondary | ICD-10-CM | POA: Diagnosis not present

## 2018-03-21 DIAGNOSIS — R278 Other lack of coordination: Secondary | ICD-10-CM | POA: Diagnosis not present

## 2018-03-21 DIAGNOSIS — E1142 Type 2 diabetes mellitus with diabetic polyneuropathy: Secondary | ICD-10-CM | POA: Diagnosis not present

## 2018-03-21 DIAGNOSIS — M4640 Discitis, unspecified, site unspecified: Secondary | ICD-10-CM | POA: Diagnosis not present

## 2018-03-21 DIAGNOSIS — R918 Other nonspecific abnormal finding of lung field: Secondary | ICD-10-CM | POA: Diagnosis not present

## 2018-03-21 DIAGNOSIS — I10 Essential (primary) hypertension: Secondary | ICD-10-CM | POA: Diagnosis not present

## 2018-03-21 DIAGNOSIS — J811 Chronic pulmonary edema: Secondary | ICD-10-CM | POA: Diagnosis not present

## 2018-03-21 DIAGNOSIS — R911 Solitary pulmonary nodule: Secondary | ICD-10-CM | POA: Diagnosis not present

## 2018-03-21 DIAGNOSIS — B961 Klebsiella pneumoniae [K. pneumoniae] as the cause of diseases classified elsewhere: Secondary | ICD-10-CM | POA: Diagnosis not present

## 2018-03-21 DIAGNOSIS — M549 Dorsalgia, unspecified: Secondary | ICD-10-CM | POA: Diagnosis not present

## 2018-03-21 DIAGNOSIS — I110 Hypertensive heart disease with heart failure: Secondary | ICD-10-CM | POA: Diagnosis not present

## 2018-03-21 DIAGNOSIS — R5381 Other malaise: Secondary | ICD-10-CM | POA: Diagnosis not present

## 2018-03-21 DIAGNOSIS — R488 Other symbolic dysfunctions: Secondary | ICD-10-CM | POA: Diagnosis not present

## 2018-03-21 DIAGNOSIS — M109 Gout, unspecified: Secondary | ICD-10-CM | POA: Diagnosis not present

## 2018-03-21 DIAGNOSIS — E1122 Type 2 diabetes mellitus with diabetic chronic kidney disease: Secondary | ICD-10-CM | POA: Diagnosis not present

## 2018-03-21 DIAGNOSIS — R0902 Hypoxemia: Secondary | ICD-10-CM | POA: Diagnosis not present

## 2018-03-21 DIAGNOSIS — G40909 Epilepsy, unspecified, not intractable, without status epilepticus: Secondary | ICD-10-CM | POA: Diagnosis not present

## 2018-03-21 DIAGNOSIS — Z9981 Dependence on supplemental oxygen: Secondary | ICD-10-CM | POA: Diagnosis not present

## 2018-03-21 DIAGNOSIS — I480 Paroxysmal atrial fibrillation: Secondary | ICD-10-CM | POA: Diagnosis not present

## 2018-04-01 DIAGNOSIS — Z794 Long term (current) use of insulin: Secondary | ICD-10-CM | POA: Diagnosis not present

## 2018-04-01 DIAGNOSIS — M4646 Discitis, unspecified, lumbar region: Secondary | ICD-10-CM | POA: Diagnosis not present

## 2018-04-01 DIAGNOSIS — E1142 Type 2 diabetes mellitus with diabetic polyneuropathy: Secondary | ICD-10-CM | POA: Diagnosis not present

## 2018-04-01 DIAGNOSIS — R5381 Other malaise: Secondary | ICD-10-CM | POA: Diagnosis not present

## 2018-04-10 DIAGNOSIS — I517 Cardiomegaly: Secondary | ICD-10-CM | POA: Diagnosis not present

## 2018-04-10 DIAGNOSIS — I480 Paroxysmal atrial fibrillation: Secondary | ICD-10-CM | POA: Diagnosis not present

## 2018-04-10 DIAGNOSIS — J9811 Atelectasis: Secondary | ICD-10-CM | POA: Diagnosis not present

## 2018-04-10 DIAGNOSIS — R0602 Shortness of breath: Secondary | ICD-10-CM | POA: Diagnosis not present

## 2018-04-10 DIAGNOSIS — Z79899 Other long term (current) drug therapy: Secondary | ICD-10-CM | POA: Diagnosis not present

## 2018-04-10 DIAGNOSIS — N179 Acute kidney failure, unspecified: Secondary | ICD-10-CM | POA: Diagnosis not present

## 2018-04-10 DIAGNOSIS — Z8673 Personal history of transient ischemic attack (TIA), and cerebral infarction without residual deficits: Secondary | ICD-10-CM | POA: Diagnosis not present

## 2018-04-10 DIAGNOSIS — Z9981 Dependence on supplemental oxygen: Secondary | ICD-10-CM | POA: Diagnosis not present

## 2018-04-10 DIAGNOSIS — N183 Chronic kidney disease, stage 3 (moderate): Secondary | ICD-10-CM | POA: Diagnosis not present

## 2018-04-10 DIAGNOSIS — J441 Chronic obstructive pulmonary disease with (acute) exacerbation: Secondary | ICD-10-CM | POA: Diagnosis not present

## 2018-04-10 DIAGNOSIS — M4646 Discitis, unspecified, lumbar region: Secondary | ICD-10-CM | POA: Diagnosis not present

## 2018-04-10 DIAGNOSIS — E1142 Type 2 diabetes mellitus with diabetic polyneuropathy: Secondary | ICD-10-CM | POA: Diagnosis not present

## 2018-04-10 DIAGNOSIS — G40909 Epilepsy, unspecified, not intractable, without status epilepticus: Secondary | ICD-10-CM | POA: Diagnosis not present

## 2018-04-10 DIAGNOSIS — R0902 Hypoxemia: Secondary | ICD-10-CM | POA: Diagnosis not present

## 2018-04-10 DIAGNOSIS — I110 Hypertensive heart disease with heart failure: Secondary | ICD-10-CM | POA: Diagnosis not present

## 2018-04-10 DIAGNOSIS — I130 Hypertensive heart and chronic kidney disease with heart failure and stage 1 through stage 4 chronic kidney disease, or unspecified chronic kidney disease: Secondary | ICD-10-CM | POA: Diagnosis not present

## 2018-04-10 DIAGNOSIS — J449 Chronic obstructive pulmonary disease, unspecified: Secondary | ICD-10-CM | POA: Diagnosis not present

## 2018-04-10 DIAGNOSIS — R600 Localized edema: Secondary | ICD-10-CM | POA: Diagnosis not present

## 2018-04-10 DIAGNOSIS — I5033 Acute on chronic diastolic (congestive) heart failure: Secondary | ICD-10-CM | POA: Diagnosis not present

## 2018-04-10 DIAGNOSIS — R911 Solitary pulmonary nodule: Secondary | ICD-10-CM | POA: Diagnosis not present

## 2018-04-10 DIAGNOSIS — K5909 Other constipation: Secondary | ICD-10-CM | POA: Diagnosis not present

## 2018-04-10 DIAGNOSIS — Z452 Encounter for adjustment and management of vascular access device: Secondary | ICD-10-CM | POA: Diagnosis not present

## 2018-04-10 DIAGNOSIS — Z743 Need for continuous supervision: Secondary | ICD-10-CM | POA: Diagnosis not present

## 2018-04-10 DIAGNOSIS — R918 Other nonspecific abnormal finding of lung field: Secondary | ICD-10-CM | POA: Diagnosis not present

## 2018-04-10 DIAGNOSIS — R590 Localized enlarged lymph nodes: Secondary | ICD-10-CM | POA: Diagnosis not present

## 2018-04-10 DIAGNOSIS — I639 Cerebral infarction, unspecified: Secondary | ICD-10-CM | POA: Diagnosis not present

## 2018-04-10 DIAGNOSIS — J9601 Acute respiratory failure with hypoxia: Secondary | ICD-10-CM | POA: Diagnosis not present

## 2018-04-10 DIAGNOSIS — J811 Chronic pulmonary edema: Secondary | ICD-10-CM | POA: Diagnosis not present

## 2018-04-10 DIAGNOSIS — I10 Essential (primary) hypertension: Secondary | ICD-10-CM | POA: Diagnosis not present

## 2018-04-10 DIAGNOSIS — K5900 Constipation, unspecified: Secondary | ICD-10-CM | POA: Diagnosis not present

## 2018-04-10 DIAGNOSIS — Z87891 Personal history of nicotine dependence: Secondary | ICD-10-CM | POA: Diagnosis not present

## 2018-04-10 DIAGNOSIS — I5032 Chronic diastolic (congestive) heart failure: Secondary | ICD-10-CM | POA: Diagnosis not present

## 2018-04-10 DIAGNOSIS — Z7901 Long term (current) use of anticoagulants: Secondary | ICD-10-CM | POA: Diagnosis not present

## 2018-04-10 DIAGNOSIS — Z794 Long term (current) use of insulin: Secondary | ICD-10-CM | POA: Diagnosis not present

## 2018-04-10 DIAGNOSIS — J90 Pleural effusion, not elsewhere classified: Secondary | ICD-10-CM | POA: Diagnosis not present

## 2018-04-10 DIAGNOSIS — E1122 Type 2 diabetes mellitus with diabetic chronic kidney disease: Secondary | ICD-10-CM | POA: Diagnosis not present

## 2018-04-10 DIAGNOSIS — I5030 Unspecified diastolic (congestive) heart failure: Secondary | ICD-10-CM | POA: Diagnosis not present

## 2018-04-28 DIAGNOSIS — J449 Chronic obstructive pulmonary disease, unspecified: Secondary | ICD-10-CM | POA: Diagnosis not present

## 2018-04-28 DIAGNOSIS — I5033 Acute on chronic diastolic (congestive) heart failure: Secondary | ICD-10-CM | POA: Diagnosis not present

## 2018-04-28 DIAGNOSIS — I480 Paroxysmal atrial fibrillation: Secondary | ICD-10-CM | POA: Diagnosis not present

## 2018-04-28 DIAGNOSIS — R0602 Shortness of breath: Secondary | ICD-10-CM | POA: Diagnosis not present

## 2018-04-28 DIAGNOSIS — Z8673 Personal history of transient ischemic attack (TIA), and cerebral infarction without residual deficits: Secondary | ICD-10-CM | POA: Diagnosis not present

## 2018-04-28 DIAGNOSIS — E1142 Type 2 diabetes mellitus with diabetic polyneuropathy: Secondary | ICD-10-CM | POA: Diagnosis not present

## 2018-04-28 DIAGNOSIS — I10 Essential (primary) hypertension: Secondary | ICD-10-CM | POA: Diagnosis not present

## 2018-04-28 DIAGNOSIS — R9431 Abnormal electrocardiogram [ECG] [EKG]: Secondary | ICD-10-CM | POA: Diagnosis not present

## 2018-04-28 DIAGNOSIS — I110 Hypertensive heart disease with heart failure: Secondary | ICD-10-CM | POA: Diagnosis not present

## 2018-04-28 DIAGNOSIS — Z794 Long term (current) use of insulin: Secondary | ICD-10-CM | POA: Diagnosis not present

## 2018-04-28 DIAGNOSIS — J90 Pleural effusion, not elsewhere classified: Secondary | ICD-10-CM | POA: Diagnosis not present

## 2018-04-28 DIAGNOSIS — J9601 Acute respiratory failure with hypoxia: Secondary | ICD-10-CM | POA: Diagnosis not present

## 2018-04-28 DIAGNOSIS — R531 Weakness: Secondary | ICD-10-CM | POA: Diagnosis not present

## 2018-04-29 DIAGNOSIS — R2232 Localized swelling, mass and lump, left upper limb: Secondary | ICD-10-CM | POA: Diagnosis not present

## 2018-04-29 DIAGNOSIS — I480 Paroxysmal atrial fibrillation: Secondary | ICD-10-CM | POA: Diagnosis not present

## 2018-04-29 DIAGNOSIS — I5033 Acute on chronic diastolic (congestive) heart failure: Secondary | ICD-10-CM | POA: Diagnosis not present

## 2018-04-29 DIAGNOSIS — I5032 Chronic diastolic (congestive) heart failure: Secondary | ICD-10-CM | POA: Diagnosis not present

## 2018-04-29 DIAGNOSIS — J90 Pleural effusion, not elsewhere classified: Secondary | ICD-10-CM | POA: Diagnosis not present

## 2018-04-29 DIAGNOSIS — M25532 Pain in left wrist: Secondary | ICD-10-CM | POA: Diagnosis not present

## 2018-04-29 DIAGNOSIS — J9601 Acute respiratory failure with hypoxia: Secondary | ICD-10-CM | POA: Diagnosis not present

## 2018-04-30 DIAGNOSIS — E1142 Type 2 diabetes mellitus with diabetic polyneuropathy: Secondary | ICD-10-CM | POA: Diagnosis not present

## 2018-04-30 DIAGNOSIS — I10 Essential (primary) hypertension: Secondary | ICD-10-CM | POA: Diagnosis not present

## 2018-04-30 DIAGNOSIS — Z794 Long term (current) use of insulin: Secondary | ICD-10-CM | POA: Diagnosis not present

## 2018-04-30 DIAGNOSIS — I517 Cardiomegaly: Secondary | ICD-10-CM | POA: Diagnosis not present

## 2018-04-30 DIAGNOSIS — J9601 Acute respiratory failure with hypoxia: Secondary | ICD-10-CM | POA: Diagnosis not present

## 2018-04-30 DIAGNOSIS — Z8673 Personal history of transient ischemic attack (TIA), and cerebral infarction without residual deficits: Secondary | ICD-10-CM | POA: Diagnosis not present

## 2018-04-30 DIAGNOSIS — I5033 Acute on chronic diastolic (congestive) heart failure: Secondary | ICD-10-CM | POA: Diagnosis not present

## 2018-04-30 DIAGNOSIS — I480 Paroxysmal atrial fibrillation: Secondary | ICD-10-CM | POA: Diagnosis not present

## 2018-04-30 DIAGNOSIS — I340 Nonrheumatic mitral (valve) insufficiency: Secondary | ICD-10-CM | POA: Diagnosis not present

## 2018-04-30 DIAGNOSIS — I5032 Chronic diastolic (congestive) heart failure: Secondary | ICD-10-CM | POA: Diagnosis not present

## 2018-04-30 DIAGNOSIS — J90 Pleural effusion, not elsewhere classified: Secondary | ICD-10-CM | POA: Diagnosis not present

## 2018-04-30 DIAGNOSIS — I071 Rheumatic tricuspid insufficiency: Secondary | ICD-10-CM | POA: Diagnosis not present

## 2018-05-01 DIAGNOSIS — I5032 Chronic diastolic (congestive) heart failure: Secondary | ICD-10-CM | POA: Diagnosis not present

## 2018-05-02 DIAGNOSIS — M25532 Pain in left wrist: Secondary | ICD-10-CM | POA: Diagnosis not present

## 2018-05-05 DIAGNOSIS — I10 Essential (primary) hypertension: Secondary | ICD-10-CM | POA: Diagnosis not present

## 2018-05-05 DIAGNOSIS — I5032 Chronic diastolic (congestive) heart failure: Secondary | ICD-10-CM | POA: Diagnosis not present

## 2018-05-05 DIAGNOSIS — I481 Persistent atrial fibrillation: Secondary | ICD-10-CM | POA: Diagnosis not present

## 2018-05-05 DIAGNOSIS — I272 Pulmonary hypertension, unspecified: Secondary | ICD-10-CM | POA: Diagnosis not present

## 2018-05-05 DIAGNOSIS — Z7901 Long term (current) use of anticoagulants: Secondary | ICD-10-CM | POA: Diagnosis not present

## 2018-05-12 ENCOUNTER — Other Ambulatory Visit: Payer: Self-pay | Admitting: *Deleted

## 2018-05-12 NOTE — Patient Outreach (Signed)
[facility]) Care Management  05/12/2018  Loc [facility] 12-16-1945 [id number]   Transition of Care Referral   Referral Date: 05/05/18 Referral Source: HTA IP discharge Date of Admission: unknown Diagnosis: unknown Date of Discharge: 05/03/18 Facility[facility] Insurance: HTA/Veteran's administration ?  Outreach attempt # 1  Outreach attempt to patient. No answer and unable to leave voicemail message the number [phone] only number listed for him in Epic is disconnected  Social: widowed has a son   Conditions: seizure, HTN, DM type 2, atrial fibrillation, hyperlipidemia, chronic systolic CHF   Plan: [facility] RN CM sent an unsuccessful outreach letter and scheduled this patient for another call attempt within 4 business days  [place] L. Noelle Penner, RN, BSN, CCM [facility] Telephonic Care Management Care Coordinator Direct Number [phone] Mobile number [phone]  Main [facility] number [phone] Fax number [phone]

## 2018-05-13 ENCOUNTER — Other Ambulatory Visit: Payer: Self-pay | Admitting: *Deleted

## 2018-05-13 NOTE — Patient Outreach (Signed)
[facility]) Care Management  05/13/2018  Tayquan Decou [facility] 05/15/1946 [id number]   Transition of Care Referral  Referral Date: 05/05/18 Referral Source: HTA IP discharge Date of Admission: unknown Diagnosis: unknown Date of Discharge: 05/03/18 [facility]  [facility] Insurance: HTA/Veteran's administration ?  Outreach attempt # 2  Outreach attempt to patient. No answer and unable to leave voicemail message the number [phone] only number listed for him in Epic is disconnected  Social: widowed has a son   Conditions: seizure, HTN, DM type 2, atrial fibrillation, hyperlipidemia, chronic systolic CHF   Plan: [facility] RN CM  scheduled this patient for another call attempt within 4 business days  [place]berly L. Noelle Penner, RN, BSN, CCM [facility] Telephonic Care Management Care Coordinator Direct Number [phone] Mobile number [phone]  Main [facility] number [phone] Fax number [phone]

## 2018-05-17 ENCOUNTER — Other Ambulatory Visit: Payer: Self-pay | Admitting: *Deleted

## 2018-05-17 NOTE — Patient Outreach (Signed)
[facility]) Care Management  05/17/2018  Victor [name] [facility] 12-May-1946 [id number]   Transition of Care Referral  Referral Date:05/05/18 Referral Source:HTA IP discharge Date of Admission:unknown Diagnosis:unknown Date of Discharge:05/03/18 [facility] Insurance:HTA/Veteran's administration ?  Outreach attempt # 3 Call attempt to listed MD office, [name] but office is closed after 1630  Call attempt to son number No answer. [facility] RN CM left HIPAA compliant voicemail message along with CM's contact info.  Outreach attempt to patient. No answer and unable to leave voicemail messagethe number [phone] only number listed for him in Epic is disconnected  Social:widowed has a son  Conditions:seizure, HTN, DM type 2, atrial fibrillation, hyperlipidemia, chronic systolic CHF   Plan: [facility] RN CM pend this patient for case closure Multiple attempts to establish contact with patient without success. No response from letter mailed to patient.   Cailin Gebel L. Noelle Penner, RN, BSN, CCM [facility] Telephonic Care Management Care Coordinator Direct Number [phone] Mobile number [phone]  Main [facility] number [phone] Fax number [phone]

## 2018-05-19 ENCOUNTER — Other Ambulatory Visit: Payer: Self-pay | Admitting: *Deleted

## 2018-05-19 NOTE — Patient Outreach (Signed)
[facility]) Care Management  05/19/2018  Liana [facility]Jerry [facility] 1945-12-21 [id number]   Care coordination  [facility] RN CM received a return call from Mr Mirelez's son, Thayer OhmChris to update [facility] RN CM that [name] has not been discharged home  Thayer OhmChris is able to verify HIPAA Reviewed and [facility] patient Thayer OhmChris confirms [name] is presently in a [facility] administration's [place] and may possibly be discharged at the end of September 2019 Thayer OhmChris states he is POA for [name] Advised patient's son that other post discharge calls may occur to assess how the patient is doing following the recent stay at the facility. Thayer OhmChris voiced understanding and was appreciative of f/u call.  Plan [facility] RN CM will close case.  Patient enrolled in external program  Ishi Danser L. Noelle PennerGibbs, RN, BSN, CCM [facility] Telephonic Care Management Care Coordinator Office number [phone] Mobile number [phone]  Main [facility] number [phone] Fax number [phone]

## 2018-05-30 ENCOUNTER — Other Ambulatory Visit: Payer: Self-pay | Admitting: *Deleted

## 2018-05-30 NOTE — Patient Outreach (Addendum)
[facility]) Care Management  05/30/2018  Liana GeroldJerry M [name] 1946/04/15 [id number]   Care coordination   [facility] RN CM  Received a call from son, Victor [name] to state [name] is pending [place] [place] Inquired about possible cost of co pay for home services CM answered questions and referred him to [employer] team advantage for co pay amount and personal care services coverage Encouraged in network coverage   Victor L. Noelle PennerGibbs, RN, BSN, CCM [facility] Telephonic Care Management Care Coordinator Office number [phone] Mobile number [phone]  Main [facility] number [phone] Fax number [phone]

## 2018-07-15 DIAGNOSIS — I130 Hypertensive heart and chronic kidney disease with heart failure and stage 1 through stage 4 chronic kidney disease, or unspecified chronic kidney disease: Secondary | ICD-10-CM | POA: Diagnosis not present

## 2018-07-15 DIAGNOSIS — E1142 Type 2 diabetes mellitus with diabetic polyneuropathy: Secondary | ICD-10-CM | POA: Diagnosis not present

## 2018-07-15 DIAGNOSIS — Z794 Long term (current) use of insulin: Secondary | ICD-10-CM | POA: Diagnosis not present

## 2018-07-15 DIAGNOSIS — M109 Gout, unspecified: Secondary | ICD-10-CM | POA: Diagnosis not present

## 2018-07-15 DIAGNOSIS — E871 Hypo-osmolality and hyponatremia: Secondary | ICD-10-CM | POA: Diagnosis not present

## 2018-07-15 DIAGNOSIS — R0989 Other specified symptoms and signs involving the circulatory and respiratory systems: Secondary | ICD-10-CM | POA: Diagnosis not present

## 2018-07-15 DIAGNOSIS — I4892 Unspecified atrial flutter: Secondary | ICD-10-CM | POA: Diagnosis not present

## 2018-07-15 DIAGNOSIS — E1122 Type 2 diabetes mellitus with diabetic chronic kidney disease: Secondary | ICD-10-CM | POA: Diagnosis not present

## 2018-07-15 DIAGNOSIS — G40909 Epilepsy, unspecified, not intractable, without status epilepticus: Secondary | ICD-10-CM | POA: Diagnosis not present

## 2018-07-15 DIAGNOSIS — I480 Paroxysmal atrial fibrillation: Secondary | ICD-10-CM | POA: Diagnosis not present

## 2018-07-15 DIAGNOSIS — Z8673 Personal history of transient ischemic attack (TIA), and cerebral infarction without residual deficits: Secondary | ICD-10-CM | POA: Diagnosis not present

## 2018-07-15 DIAGNOSIS — N179 Acute kidney failure, unspecified: Secondary | ICD-10-CM | POA: Diagnosis not present

## 2018-07-15 DIAGNOSIS — J449 Chronic obstructive pulmonary disease, unspecified: Secondary | ICD-10-CM | POA: Diagnosis not present

## 2018-07-15 DIAGNOSIS — Z79899 Other long term (current) drug therapy: Secondary | ICD-10-CM | POA: Diagnosis not present

## 2018-07-15 DIAGNOSIS — R531 Weakness: Secondary | ICD-10-CM | POA: Diagnosis not present

## 2018-07-15 DIAGNOSIS — I5032 Chronic diastolic (congestive) heart failure: Secondary | ICD-10-CM | POA: Diagnosis not present

## 2018-07-15 DIAGNOSIS — R262 Difficulty in walking, not elsewhere classified: Secondary | ICD-10-CM | POA: Diagnosis not present

## 2018-07-15 DIAGNOSIS — Z87891 Personal history of nicotine dependence: Secondary | ICD-10-CM | POA: Diagnosis not present

## 2018-07-15 DIAGNOSIS — N183 Chronic kidney disease, stage 3 (moderate): Secondary | ICD-10-CM | POA: Diagnosis not present

## 2018-07-15 DIAGNOSIS — I129 Hypertensive chronic kidney disease with stage 1 through stage 4 chronic kidney disease, or unspecified chronic kidney disease: Secondary | ICD-10-CM | POA: Diagnosis not present

## 2018-07-15 DIAGNOSIS — I10 Essential (primary) hypertension: Secondary | ICD-10-CM | POA: Diagnosis not present

## 2018-08-02 DIAGNOSIS — R0609 Other forms of dyspnea: Secondary | ICD-10-CM | POA: Diagnosis not present

## 2018-08-02 DIAGNOSIS — N183 Chronic kidney disease, stage 3 (moderate): Secondary | ICD-10-CM | POA: Diagnosis not present

## 2018-08-02 DIAGNOSIS — N189 Chronic kidney disease, unspecified: Secondary | ICD-10-CM | POA: Diagnosis not present

## 2018-08-02 DIAGNOSIS — J90 Pleural effusion, not elsewhere classified: Secondary | ICD-10-CM | POA: Diagnosis not present

## 2018-08-02 DIAGNOSIS — I10 Essential (primary) hypertension: Secondary | ICD-10-CM | POA: Diagnosis not present

## 2018-08-02 DIAGNOSIS — Z8639 Personal history of other endocrine, nutritional and metabolic disease: Secondary | ICD-10-CM | POA: Diagnosis not present

## 2018-08-02 DIAGNOSIS — Z794 Long term (current) use of insulin: Secondary | ICD-10-CM | POA: Diagnosis not present

## 2018-08-02 DIAGNOSIS — E1122 Type 2 diabetes mellitus with diabetic chronic kidney disease: Secondary | ICD-10-CM | POA: Diagnosis not present

## 2018-08-02 DIAGNOSIS — R0902 Hypoxemia: Secondary | ICD-10-CM | POA: Diagnosis not present

## 2018-08-02 DIAGNOSIS — R7989 Other specified abnormal findings of blood chemistry: Secondary | ICD-10-CM | POA: Diagnosis not present

## 2018-08-02 DIAGNOSIS — I480 Paroxysmal atrial fibrillation: Secondary | ICD-10-CM | POA: Diagnosis not present

## 2018-08-02 DIAGNOSIS — I5031 Acute diastolic (congestive) heart failure: Secondary | ICD-10-CM | POA: Diagnosis not present

## 2018-08-02 DIAGNOSIS — I130 Hypertensive heart and chronic kidney disease with heart failure and stage 1 through stage 4 chronic kidney disease, or unspecified chronic kidney disease: Secondary | ICD-10-CM | POA: Diagnosis not present

## 2018-08-02 DIAGNOSIS — G40909 Epilepsy, unspecified, not intractable, without status epilepticus: Secondary | ICD-10-CM | POA: Diagnosis not present

## 2018-08-02 DIAGNOSIS — E1142 Type 2 diabetes mellitus with diabetic polyneuropathy: Secondary | ICD-10-CM | POA: Diagnosis not present

## 2018-08-02 DIAGNOSIS — I5033 Acute on chronic diastolic (congestive) heart failure: Secondary | ICD-10-CM | POA: Diagnosis not present

## 2018-08-02 DIAGNOSIS — J449 Chronic obstructive pulmonary disease, unspecified: Secondary | ICD-10-CM | POA: Diagnosis not present

## 2018-08-03 DIAGNOSIS — I480 Paroxysmal atrial fibrillation: Secondary | ICD-10-CM | POA: Diagnosis not present

## 2018-08-03 DIAGNOSIS — G40909 Epilepsy, unspecified, not intractable, without status epilepticus: Secondary | ICD-10-CM | POA: Diagnosis not present

## 2018-08-03 DIAGNOSIS — I10 Essential (primary) hypertension: Secondary | ICD-10-CM | POA: Diagnosis not present

## 2018-08-03 DIAGNOSIS — I5031 Acute diastolic (congestive) heart failure: Secondary | ICD-10-CM | POA: Diagnosis not present

## 2018-08-03 DIAGNOSIS — R918 Other nonspecific abnormal finding of lung field: Secondary | ICD-10-CM | POA: Diagnosis not present

## 2018-08-03 DIAGNOSIS — J90 Pleural effusion, not elsewhere classified: Secondary | ICD-10-CM | POA: Diagnosis not present

## 2018-08-03 DIAGNOSIS — Z794 Long term (current) use of insulin: Secondary | ICD-10-CM | POA: Diagnosis not present

## 2018-08-03 DIAGNOSIS — I083 Combined rheumatic disorders of mitral, aortic and tricuspid valves: Secondary | ICD-10-CM | POA: Diagnosis not present

## 2018-08-03 DIAGNOSIS — E1142 Type 2 diabetes mellitus with diabetic polyneuropathy: Secondary | ICD-10-CM | POA: Diagnosis not present

## 2018-08-03 DIAGNOSIS — N183 Chronic kidney disease, stage 3 (moderate): Secondary | ICD-10-CM | POA: Diagnosis not present

## 2018-08-03 DIAGNOSIS — J449 Chronic obstructive pulmonary disease, unspecified: Secondary | ICD-10-CM | POA: Diagnosis not present

## 2018-08-04 DIAGNOSIS — I10 Essential (primary) hypertension: Secondary | ICD-10-CM | POA: Diagnosis not present

## 2018-08-04 DIAGNOSIS — I5031 Acute diastolic (congestive) heart failure: Secondary | ICD-10-CM | POA: Diagnosis not present

## 2018-08-04 DIAGNOSIS — N183 Chronic kidney disease, stage 3 (moderate): Secondary | ICD-10-CM | POA: Diagnosis not present

## 2018-08-04 DIAGNOSIS — Z794 Long term (current) use of insulin: Secondary | ICD-10-CM | POA: Diagnosis not present

## 2018-08-04 DIAGNOSIS — I480 Paroxysmal atrial fibrillation: Secondary | ICD-10-CM | POA: Diagnosis not present

## 2018-08-04 DIAGNOSIS — J90 Pleural effusion, not elsewhere classified: Secondary | ICD-10-CM | POA: Diagnosis not present

## 2018-08-04 DIAGNOSIS — E1142 Type 2 diabetes mellitus with diabetic polyneuropathy: Secondary | ICD-10-CM | POA: Diagnosis not present

## 2018-08-04 DIAGNOSIS — G40909 Epilepsy, unspecified, not intractable, without status epilepticus: Secondary | ICD-10-CM | POA: Diagnosis not present

## 2018-08-04 DIAGNOSIS — J449 Chronic obstructive pulmonary disease, unspecified: Secondary | ICD-10-CM | POA: Diagnosis not present

## 2018-08-05 DIAGNOSIS — I10 Essential (primary) hypertension: Secondary | ICD-10-CM | POA: Diagnosis not present

## 2018-08-05 DIAGNOSIS — I5031 Acute diastolic (congestive) heart failure: Secondary | ICD-10-CM | POA: Diagnosis not present

## 2018-08-05 DIAGNOSIS — Z9889 Other specified postprocedural states: Secondary | ICD-10-CM | POA: Diagnosis not present

## 2018-08-05 DIAGNOSIS — E1142 Type 2 diabetes mellitus with diabetic polyneuropathy: Secondary | ICD-10-CM | POA: Diagnosis not present

## 2018-08-05 DIAGNOSIS — N183 Chronic kidney disease, stage 3 (moderate): Secondary | ICD-10-CM | POA: Diagnosis not present

## 2018-08-05 DIAGNOSIS — G40909 Epilepsy, unspecified, not intractable, without status epilepticus: Secondary | ICD-10-CM | POA: Diagnosis not present

## 2018-08-05 DIAGNOSIS — J90 Pleural effusion, not elsewhere classified: Secondary | ICD-10-CM | POA: Diagnosis not present

## 2018-08-05 DIAGNOSIS — I480 Paroxysmal atrial fibrillation: Secondary | ICD-10-CM | POA: Diagnosis not present

## 2018-08-05 DIAGNOSIS — J449 Chronic obstructive pulmonary disease, unspecified: Secondary | ICD-10-CM | POA: Diagnosis not present

## 2018-08-05 DIAGNOSIS — Z794 Long term (current) use of insulin: Secondary | ICD-10-CM | POA: Diagnosis not present

## 2018-08-06 DIAGNOSIS — I480 Paroxysmal atrial fibrillation: Secondary | ICD-10-CM | POA: Diagnosis not present

## 2018-08-06 DIAGNOSIS — J90 Pleural effusion, not elsewhere classified: Secondary | ICD-10-CM | POA: Diagnosis not present

## 2018-08-06 DIAGNOSIS — I10 Essential (primary) hypertension: Secondary | ICD-10-CM | POA: Diagnosis not present

## 2018-08-06 DIAGNOSIS — Z794 Long term (current) use of insulin: Secondary | ICD-10-CM | POA: Diagnosis not present

## 2018-08-06 DIAGNOSIS — J449 Chronic obstructive pulmonary disease, unspecified: Secondary | ICD-10-CM | POA: Diagnosis not present

## 2018-08-06 DIAGNOSIS — I5031 Acute diastolic (congestive) heart failure: Secondary | ICD-10-CM | POA: Diagnosis not present

## 2018-08-06 DIAGNOSIS — N183 Chronic kidney disease, stage 3 (moderate): Secondary | ICD-10-CM | POA: Diagnosis not present

## 2018-08-06 DIAGNOSIS — G40909 Epilepsy, unspecified, not intractable, without status epilepticus: Secondary | ICD-10-CM | POA: Diagnosis not present

## 2018-08-06 DIAGNOSIS — E1142 Type 2 diabetes mellitus with diabetic polyneuropathy: Secondary | ICD-10-CM | POA: Diagnosis not present

## 2018-08-16 DIAGNOSIS — Z794 Long term (current) use of insulin: Secondary | ICD-10-CM | POA: Diagnosis not present

## 2018-08-16 DIAGNOSIS — R6889 Other general symptoms and signs: Secondary | ICD-10-CM | POA: Diagnosis not present

## 2018-08-16 DIAGNOSIS — E161 Other hypoglycemia: Secondary | ICD-10-CM | POA: Diagnosis not present

## 2018-08-16 DIAGNOSIS — I10 Essential (primary) hypertension: Secondary | ICD-10-CM | POA: Diagnosis not present

## 2018-08-16 DIAGNOSIS — R2689 Other abnormalities of gait and mobility: Secondary | ICD-10-CM | POA: Diagnosis not present

## 2018-08-16 DIAGNOSIS — E162 Hypoglycemia, unspecified: Secondary | ICD-10-CM | POA: Diagnosis not present

## 2018-08-16 DIAGNOSIS — E11649 Type 2 diabetes mellitus with hypoglycemia without coma: Secondary | ICD-10-CM | POA: Diagnosis not present

## 2018-08-22 DIAGNOSIS — Z77098 Contact with and (suspected) exposure to other hazardous, chiefly nonmedicinal, chemicals: Secondary | ICD-10-CM | POA: Diagnosis not present

## 2018-08-22 DIAGNOSIS — E1142 Type 2 diabetes mellitus with diabetic polyneuropathy: Secondary | ICD-10-CM | POA: Diagnosis not present

## 2018-08-22 DIAGNOSIS — L853 Xerosis cutis: Secondary | ICD-10-CM | POA: Diagnosis not present

## 2018-08-22 DIAGNOSIS — B351 Tinea unguium: Secondary | ICD-10-CM | POA: Diagnosis not present

## 2018-08-26 ENCOUNTER — Other Ambulatory Visit: Payer: Self-pay | Admitting: *Deleted

## 2018-08-29 DIAGNOSIS — R569 Unspecified convulsions: Secondary | ICD-10-CM | POA: Diagnosis not present

## 2018-08-29 DIAGNOSIS — J984 Other disorders of lung: Secondary | ICD-10-CM | POA: Diagnosis not present

## 2018-08-29 DIAGNOSIS — I130 Hypertensive heart and chronic kidney disease with heart failure and stage 1 through stage 4 chronic kidney disease, or unspecified chronic kidney disease: Secondary | ICD-10-CM | POA: Diagnosis not present

## 2018-08-29 DIAGNOSIS — J449 Chronic obstructive pulmonary disease, unspecified: Secondary | ICD-10-CM | POA: Diagnosis not present

## 2018-08-29 DIAGNOSIS — R0902 Hypoxemia: Secondary | ICD-10-CM | POA: Diagnosis not present

## 2018-08-29 DIAGNOSIS — Z87891 Personal history of nicotine dependence: Secondary | ICD-10-CM | POA: Diagnosis not present

## 2018-08-29 DIAGNOSIS — N184 Chronic kidney disease, stage 4 (severe): Secondary | ICD-10-CM | POA: Diagnosis not present

## 2018-08-29 DIAGNOSIS — J9601 Acute respiratory failure with hypoxia: Secondary | ICD-10-CM | POA: Diagnosis not present

## 2018-08-29 DIAGNOSIS — Z7951 Long term (current) use of inhaled steroids: Secondary | ICD-10-CM | POA: Diagnosis not present

## 2018-08-29 DIAGNOSIS — J90 Pleural effusion, not elsewhere classified: Secondary | ICD-10-CM | POA: Diagnosis not present

## 2018-08-29 DIAGNOSIS — E871 Hypo-osmolality and hyponatremia: Secondary | ICD-10-CM | POA: Diagnosis not present

## 2018-08-29 DIAGNOSIS — I509 Heart failure, unspecified: Secondary | ICD-10-CM | POA: Diagnosis not present

## 2018-08-29 DIAGNOSIS — E1122 Type 2 diabetes mellitus with diabetic chronic kidney disease: Secondary | ICD-10-CM | POA: Diagnosis not present

## 2018-08-29 DIAGNOSIS — R0989 Other specified symptoms and signs involving the circulatory and respiratory systems: Secondary | ICD-10-CM | POA: Diagnosis not present

## 2018-08-29 DIAGNOSIS — I517 Cardiomegaly: Secondary | ICD-10-CM | POA: Diagnosis not present

## 2018-08-29 DIAGNOSIS — I071 Rheumatic tricuspid insufficiency: Secondary | ICD-10-CM | POA: Diagnosis not present

## 2018-08-29 DIAGNOSIS — Z48813 Encounter for surgical aftercare following surgery on the respiratory system: Secondary | ICD-10-CM | POA: Diagnosis not present

## 2018-08-29 DIAGNOSIS — I480 Paroxysmal atrial fibrillation: Secondary | ICD-10-CM | POA: Diagnosis not present

## 2018-08-29 DIAGNOSIS — R0602 Shortness of breath: Secondary | ICD-10-CM | POA: Diagnosis not present

## 2018-08-29 DIAGNOSIS — I5033 Acute on chronic diastolic (congestive) heart failure: Secondary | ICD-10-CM | POA: Diagnosis not present

## 2018-08-29 DIAGNOSIS — R279 Unspecified lack of coordination: Secondary | ICD-10-CM | POA: Diagnosis not present

## 2018-08-29 DIAGNOSIS — Z743 Need for continuous supervision: Secondary | ICD-10-CM | POA: Diagnosis not present

## 2018-08-29 DIAGNOSIS — I10 Essential (primary) hypertension: Secondary | ICD-10-CM | POA: Diagnosis not present

## 2018-08-29 DIAGNOSIS — Z7901 Long term (current) use of anticoagulants: Secondary | ICD-10-CM | POA: Diagnosis not present

## 2018-08-29 DIAGNOSIS — K5900 Constipation, unspecified: Secondary | ICD-10-CM | POA: Diagnosis not present

## 2018-08-29 DIAGNOSIS — I272 Pulmonary hypertension, unspecified: Secondary | ICD-10-CM | POA: Diagnosis not present

## 2018-08-29 DIAGNOSIS — Z794 Long term (current) use of insulin: Secondary | ICD-10-CM | POA: Diagnosis not present

## 2018-08-29 DIAGNOSIS — R069 Unspecified abnormalities of breathing: Secondary | ICD-10-CM | POA: Diagnosis not present

## 2018-08-29 DIAGNOSIS — E0822 Diabetes mellitus due to underlying condition with diabetic chronic kidney disease: Secondary | ICD-10-CM | POA: Diagnosis not present

## 2018-08-29 DIAGNOSIS — Z79899 Other long term (current) drug therapy: Secondary | ICD-10-CM | POA: Diagnosis not present

## 2018-08-29 DIAGNOSIS — E876 Hypokalemia: Secondary | ICD-10-CM | POA: Diagnosis not present

## 2018-08-29 DIAGNOSIS — N179 Acute kidney failure, unspecified: Secondary | ICD-10-CM | POA: Diagnosis not present

## 2018-08-29 DIAGNOSIS — F10929 Alcohol use, unspecified with intoxication, unspecified: Secondary | ICD-10-CM | POA: Diagnosis not present

## 2018-08-29 DIAGNOSIS — I110 Hypertensive heart disease with heart failure: Secondary | ICD-10-CM | POA: Diagnosis not present

## 2018-08-29 DIAGNOSIS — Z8673 Personal history of transient ischemic attack (TIA), and cerebral infarction without residual deficits: Secondary | ICD-10-CM | POA: Diagnosis not present

## 2018-08-29 DIAGNOSIS — N183 Chronic kidney disease, stage 3 (moderate): Secondary | ICD-10-CM | POA: Diagnosis not present

## 2018-08-29 DIAGNOSIS — R918 Other nonspecific abnormal finding of lung field: Secondary | ICD-10-CM | POA: Diagnosis not present

## 2018-08-29 DIAGNOSIS — J9811 Atelectasis: Secondary | ICD-10-CM | POA: Diagnosis not present

## 2018-08-29 DIAGNOSIS — J42 Unspecified chronic bronchitis: Secondary | ICD-10-CM | POA: Diagnosis not present

## 2018-09-11 DIAGNOSIS — R0602 Shortness of breath: Secondary | ICD-10-CM | POA: Diagnosis not present

## 2018-09-16 ENCOUNTER — Other Ambulatory Visit: Payer: Self-pay | Admitting: *Deleted

## 2018-09-16 NOTE — Patient Outreach (Signed)
Chart review only.  Victor [name]. Burgess Estelle, MSN, [facility] Gerontological Nurse Practitioner [facility] Care Management [phone]

## 2018-09-16 NOTE — Patient Outreach (Signed)
[facility]) Care Management  09/16/2018  Kailyn Bergemann [facility] 1945/10/29 [id number]   Transition of Care Referral   Referral Date: 08/09/18 Referral Source: HTA IP discharge  Date of Admission:  Diagnosis:  Date of Discharge: on 08/06/18  Facility[facility]  Insurance: HTA   Outreach attempt # 1 to [phone] indicates the number is disconnected  Outreach attempt # 1 to son's number  No answer. [facility] RN CM left HIPAA compliant voicemail message along with CM's contact info for the son  Plan: [facility] RN CM sent an unsuccessful outreach letter and scheduled this patient for another call attempt within 4 business days   Kimberly L. Noelle Penner, RN, BSN, CCM [facility] Telephonic Care Management Care Coordinator Direct Number [phone] Mobile number [phone]  Main [facility] number [phone] Fax number [phone]

## 2018-09-19 ENCOUNTER — Other Ambulatory Visit: Payer: Non-veteran care | Admitting: *Deleted

## 2018-09-19 NOTE — Patient Outreach (Signed)
[facility]) Care Management  09/19/2018  Victor [name] [facility] 1946-02-21 [id number]   Transition of Care Referral  Referral Date: 08/09/18 Referral Source: HTA IP discharge  Date of Admission:  Diagnosis:  Date of Discharge: on 08/06/18  [facility]  Insurance: HTA   Outreach attempt # 2 to son's number as the patient's number was noted disconnected on 09/16/2018 No answer. [facility] RN CM left HIPAA compliant voicemail message along with CM's contact info for the son  Plan: [facility] RN CM scheduled this patient for  A final call attempt within 4 business days   Saban Heinlen L. Noelle Penner, RN, BSN, CCM [facility] Telephonic Care Management Care Coordinator Direct Number [phone] Mobile number [phone]  Main [facility] number [phone] Fax number [phone]

## 2018-09-21 ENCOUNTER — Other Ambulatory Visit: Payer: Self-pay | Admitting: *Deleted

## 2018-09-21 NOTE — Patient Outreach (Signed)
[facility]) Care Management  09/21/2018  Clintin Vandyken [facility] December 25, 1945 [id number]   Transition of Care Referral  Referral Date:08/09/18 Referral Source:HTA IP discharge Date of Admission:  Diagnosis:  Date of Discharge:on 08/06/18 Facility:[facility] Insurance:HTA  Outreach attempt # 3 to son's numberas the patient's number was noted disconnected on 09/16/2018  No answer. [facility] RN CM left HIPAA compliant voicemail message along with CM's contact info.   Plan: [facility] RN CM scheduled this patient for case closure within 7-10 business days if not response from pt or son  [place] L. Noelle Penner, RN, BSN, CCM [facility] Telephonic Care Management Care Coordinator Office number [phone] Mobile number [phone]  Main [facility] number [phone] Fax number [phone]

## 2018-09-22 DIAGNOSIS — R0602 Shortness of breath: Secondary | ICD-10-CM | POA: Diagnosis not present

## 2018-09-23 DIAGNOSIS — J988 Other specified respiratory disorders: Secondary | ICD-10-CM | POA: Diagnosis not present

## 2018-09-30 ENCOUNTER — Other Ambulatory Visit: Payer: Self-pay | Admitting: *Deleted

## 2018-09-30 NOTE — Patient Outreach (Signed)
[facility]) Care Management  09/30/2018  Melvan Erisman [facility] 29-Mar-1946 [id number]   Case closure   Call attempts made on 09/16/2018, 09/19/2018 and 09/21/2018 for transition of care  Unsuccessful outreach letter sent on 09/16/2018  without a response   Plan [facility] RN CM will close case after no response from patient within 10 business days. Unable to reach  [place] L. Noelle Penner, RN, BSN, CCM [facility] Telephonic Care Management Care Coordinator Office number [phone] Mobile number [phone]  Main [facility] number [phone] Fax number [phone]

## 2018-10-06 ENCOUNTER — Emergency Department (HOSPITAL_COMMUNITY): Admission: EM | Admit: 2018-10-06 | Payer: Non-veteran care | Source: Home / Self Care

## 2018-10-08 DEATH — deceased

## 2022-01-30 ENCOUNTER — Other Ambulatory Visit: Payer: Self-pay | Admitting: Urology

## 2022-01-30 DIAGNOSIS — R972 Elevated prostate specific antigen [PSA]: Secondary | ICD-10-CM
